# Patient Record
Sex: Female | Born: 1990 | Race: White | Hispanic: No | Marital: Married | Smoking: Current every day smoker
Health system: Southern US, Community
[De-identification: ages and names within clinical notes are randomized; demographics above are authoritative.]

## PROBLEM LIST (undated history)

## (undated) DIAGNOSIS — D649 Anemia, unspecified: Secondary | ICD-10-CM

## (undated) DIAGNOSIS — D849 Immunodeficiency, unspecified: Secondary | ICD-10-CM

## (undated) DIAGNOSIS — R198 Other specified symptoms and signs involving the digestive system and abdomen: Secondary | ICD-10-CM

## (undated) DIAGNOSIS — K589 Irritable bowel syndrome without diarrhea: Secondary | ICD-10-CM

## (undated) DIAGNOSIS — F329 Major depressive disorder, single episode, unspecified: Secondary | ICD-10-CM

## (undated) DIAGNOSIS — F32A Depression, unspecified: Secondary | ICD-10-CM

## (undated) DIAGNOSIS — F419 Anxiety disorder, unspecified: Secondary | ICD-10-CM

## (undated) HISTORY — PX: ABDOMINAL HYSTERECTOMY: SHX81

## (undated) HISTORY — PX: APPENDECTOMY: SHX54

## (undated) HISTORY — DX: Anxiety disorder, unspecified: F41.9

## (undated) HISTORY — PX: WISDOM TOOTH EXTRACTION: SHX21

## (undated) HISTORY — PX: TONSILLECTOMY: SUR1361

## (undated) HISTORY — PX: OTHER SURGICAL HISTORY: SHX169

---
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
## 2010-02-21 ENCOUNTER — Emergency Department: Payer: Self-pay | Admitting: Emergency Medicine

## 2010-06-01 ENCOUNTER — Emergency Department: Payer: Self-pay | Admitting: Emergency Medicine

## 2012-05-13 ENCOUNTER — Emergency Department: Payer: Self-pay | Admitting: Emergency Medicine

## 2012-05-13 LAB — CBC WITH DIFFERENTIAL/PLATELET
Basophil #: 0 10*3/uL (ref 0.0–0.1)
Basophil %: 0.2 %
Eosinophil #: 0 10*3/uL (ref 0.0–0.7)
Eosinophil %: 0.3 %
HCT: 39 % (ref 35.0–47.0)
HGB: 13.5 g/dL (ref 12.0–16.0)
Lymphocyte #: 2 10*3/uL (ref 1.0–3.6)
Lymphocyte %: 12.5 %
MCH: 29.1 pg (ref 26.0–34.0)
MCHC: 34.6 g/dL (ref 32.0–36.0)
MCV: 84 fL (ref 80–100)
Monocyte #: 0.6 x10 3/mm (ref 0.2–0.9)
Monocyte %: 3.5 %
Neutrophil #: 13.4 10*3/uL — ABNORMAL HIGH (ref 1.4–6.5)
Neutrophil %: 83.5 %
Platelet: 156 10*3/uL (ref 150–440)
RBC: 4.63 10*6/uL (ref 3.80–5.20)
RDW: 13.3 % (ref 11.5–14.5)
WBC: 16 10*3/uL — ABNORMAL HIGH (ref 3.6–11.0)

## 2012-05-13 LAB — URINALYSIS, COMPLETE
Bacteria: NONE SEEN
Bilirubin,UR: NEGATIVE
Glucose,UR: NEGATIVE mg/dL (ref 0–75)
Ketone: NEGATIVE
Leukocyte Esterase: NEGATIVE
Nitrite: NEGATIVE
Ph: 6 (ref 4.5–8.0)
Protein: NEGATIVE
RBC,UR: 6 /HPF (ref 0–5)
Specific Gravity: 1.008 (ref 1.003–1.030)
Squamous Epithelial: NONE SEEN
WBC UR: <1 /HPF (ref 0–5)

## 2012-05-13 LAB — COMPREHENSIVE METABOLIC PANEL
Albumin: 3.8 g/dL (ref 3.4–5.0)
Alkaline Phosphatase: 63 U/L (ref 50–136)
Anion Gap: 7 (ref 7–16)
BUN: 5 mg/dL — ABNORMAL LOW (ref 7–18)
Bilirubin,Total: 0.4 mg/dL (ref 0.2–1.0)
Calcium, Total: 8.6 mg/dL (ref 8.5–10.1)
Chloride: 109 mmol/L — ABNORMAL HIGH (ref 98–107)
Co2: 25 mmol/L (ref 21–32)
Creatinine: 0.76 mg/dL (ref 0.60–1.30)
EGFR (African American): >60
EGFR (Non-African Amer.): >60
Glucose: 114 mg/dL — ABNORMAL HIGH (ref 65–99)
Osmolality: 279 (ref 275–301)
Potassium: 3.6 mmol/L (ref 3.5–5.1)
SGOT(AST): 15 U/L (ref 15–37)
SGPT (ALT): 14 U/L (ref 12–78)
Sodium: 141 mmol/L (ref 136–145)
Total Protein: 6.3 g/dL — ABNORMAL LOW (ref 6.4–8.2)

## 2012-05-13 LAB — DRUG SCREEN, URINE

## 2012-05-13 LAB — LIPASE, BLOOD: Lipase: 125 U/L (ref 73–393)

## 2012-05-13 LAB — WET PREP, GENITAL

## 2012-05-13 LAB — PREGNANCY, URINE: Pregnancy Test, Urine: NEGATIVE m[IU]/mL

## 2012-12-08 ENCOUNTER — Emergency Department: Payer: Self-pay | Admitting: Emergency Medicine

## 2013-03-03 ENCOUNTER — Emergency Department: Payer: Self-pay | Admitting: Emergency Medicine

## 2013-03-03 LAB — BASIC METABOLIC PANEL
Anion Gap: 8 (ref 7–16)
BUN: 6 mg/dL — ABNORMAL LOW (ref 7–18)
Calcium, Total: 9.2 mg/dL (ref 8.5–10.1)
Chloride: 102 mmol/L (ref 98–107)
Co2: 26 mmol/L (ref 21–32)
Creatinine: 0.71 mg/dL (ref 0.60–1.30)
EGFR (African American): >60
EGFR (Non-African Amer.): >60
Glucose: 141 mg/dL — ABNORMAL HIGH (ref 65–99)
Osmolality: 272 (ref 275–301)
Potassium: 3.5 mmol/L (ref 3.5–5.1)
Sodium: 136 mmol/L (ref 136–145)

## 2013-03-03 LAB — CBC
HCT: 42.2 % (ref 35.0–47.0)
HGB: 14.4 g/dL (ref 12.0–16.0)
MCH: 28.2 pg (ref 26.0–34.0)
MCHC: 34.2 g/dL (ref 32.0–36.0)
MCV: 82 fL (ref 80–100)
Platelet: 193 10*3/uL (ref 150–440)
RBC: 5.12 10*6/uL (ref 3.80–5.20)
RDW: 13 % (ref 11.5–14.5)
WBC: 11.6 10*3/uL — ABNORMAL HIGH (ref 3.6–11.0)

## 2013-03-03 LAB — HCG, QUANTITATIVE, PREGNANCY: Beta Hcg, Quant.: 52869 m[IU]/mL — ABNORMAL HIGH

## 2013-03-15 ENCOUNTER — Emergency Department: Payer: Self-pay | Admitting: Unknown Physician Specialty

## 2013-03-15 LAB — COMPREHENSIVE METABOLIC PANEL
Albumin: 3.7 g/dL (ref 3.4–5.0)
Alkaline Phosphatase: 64 U/L (ref 50–136)
Anion Gap: 6 — ABNORMAL LOW (ref 7–16)
BUN: 5 mg/dL — ABNORMAL LOW (ref 7–18)
Bilirubin,Total: 0.4 mg/dL (ref 0.2–1.0)
Calcium, Total: 9 mg/dL (ref 8.5–10.1)
Chloride: 107 mmol/L (ref 98–107)
Co2: 25 mmol/L (ref 21–32)
Creatinine: 0.41 mg/dL — ABNORMAL LOW (ref 0.60–1.30)
EGFR (African American): >60
EGFR (Non-African Amer.): >60
Glucose: 84 mg/dL (ref 65–99)
Osmolality: 272 (ref 275–301)
Potassium: 4.1 mmol/L (ref 3.5–5.1)
SGOT(AST): 17 U/L (ref 15–37)
SGPT (ALT): 13 U/L (ref 12–78)
Sodium: 138 mmol/L (ref 136–145)
Total Protein: 6.4 g/dL (ref 6.4–8.2)

## 2013-03-15 LAB — CBC WITH DIFFERENTIAL/PLATELET
Basophil #: 0 10*3/uL (ref 0.0–0.1)
Basophil %: 0.4 %
Eosinophil #: 0 10*3/uL (ref 0.0–0.7)
Eosinophil %: 0.3 %
HCT: 38.8 % (ref 35.0–47.0)
HGB: 13.6 g/dL (ref 12.0–16.0)
Lymphocyte #: 2.3 10*3/uL (ref 1.0–3.6)
Lymphocyte %: 24.2 %
MCH: 28.6 pg (ref 26.0–34.0)
MCHC: 34.9 g/dL (ref 32.0–36.0)
MCV: 82 fL (ref 80–100)
Monocyte #: 0.4 x10 3/mm (ref 0.2–0.9)
Monocyte %: 4.3 %
Neutrophil #: 6.7 10*3/uL — ABNORMAL HIGH (ref 1.4–6.5)
Neutrophil %: 70.8 %
Platelet: 171 10*3/uL (ref 150–440)
RBC: 4.73 10*6/uL (ref 3.80–5.20)
RDW: 13.1 % (ref 11.5–14.5)
WBC: 9.5 10*3/uL (ref 3.6–11.0)

## 2013-03-15 LAB — URINALYSIS, COMPLETE
Bacteria: NONE SEEN
Bilirubin,UR: NEGATIVE
Blood: NEGATIVE
Glucose,UR: NEGATIVE mg/dL (ref 0–75)
Nitrite: NEGATIVE
Ph: 7 (ref 4.5–8.0)
Protein: NEGATIVE
RBC,UR: 1 /HPF (ref 0–5)
Specific Gravity: 1.013 (ref 1.003–1.030)
Squamous Epithelial: 2
WBC UR: 2 /HPF (ref 0–5)

## 2013-03-15 LAB — HCG, QUANTITATIVE, PREGNANCY: Beta Hcg, Quant.: 132333 m[IU]/mL — ABNORMAL HIGH

## 2013-04-14 ENCOUNTER — Encounter: Payer: Self-pay | Admitting: Obstetrics and Gynecology

## 2013-05-17 ENCOUNTER — Emergency Department: Payer: Self-pay | Admitting: Emergency Medicine

## 2013-05-17 LAB — CBC
HCT: 33 % — ABNORMAL LOW (ref 35.0–47.0)
HGB: 11.6 g/dL — ABNORMAL LOW (ref 12.0–16.0)
MCH: 29.7 pg (ref 26.0–34.0)
MCHC: 35.2 g/dL (ref 32.0–36.0)
MCV: 85 fL (ref 80–100)
Platelet: 154 10*3/uL (ref 150–440)
RBC: 3.9 10*6/uL (ref 3.80–5.20)
RDW: 14.4 % (ref 11.5–14.5)
WBC: 13.8 10*3/uL — ABNORMAL HIGH (ref 3.6–11.0)

## 2013-05-17 LAB — URINALYSIS, COMPLETE
Bilirubin,UR: NEGATIVE
Blood: NEGATIVE
Glucose,UR: NEGATIVE mg/dL (ref 0–75)
Ketone: NEGATIVE
Leukocyte Esterase: NEGATIVE
Nitrite: NEGATIVE
Ph: 7 (ref 4.5–8.0)
Protein: NEGATIVE
RBC,UR: 1 /HPF (ref 0–5)
Specific Gravity: 1.019 (ref 1.003–1.030)
Squamous Epithelial: NONE SEEN
WBC UR: 1 /HPF (ref 0–5)

## 2013-05-17 LAB — BASIC METABOLIC PANEL
Anion Gap: 8 (ref 7–16)
BUN: 6 mg/dL — ABNORMAL LOW (ref 7–18)
Calcium, Total: 9 mg/dL (ref 8.5–10.1)
Chloride: 106 mmol/L (ref 98–107)
Co2: 22 mmol/L (ref 21–32)
Creatinine: 0.49 mg/dL — ABNORMAL LOW (ref 0.60–1.30)
EGFR (African American): >60
EGFR (Non-African Amer.): >60
Glucose: 83 mg/dL (ref 65–99)
Osmolality: 269 (ref 275–301)
Potassium: 3.6 mmol/L (ref 3.5–5.1)
Sodium: 136 mmol/L (ref 136–145)

## 2013-05-17 LAB — HCG, QUANTITATIVE, PREGNANCY: Beta Hcg, Quant.: 9922 m[IU]/mL — ABNORMAL HIGH

## 2013-05-18 LAB — WET PREP, GENITAL

## 2013-08-19 ENCOUNTER — Observation Stay: Payer: Self-pay

## 2013-08-20 LAB — URINALYSIS, COMPLETE
Bacteria: NONE SEEN
Bilirubin,UR: NEGATIVE
Blood: NEGATIVE
Glucose,UR: NEGATIVE mg/dL (ref 0–75)
Ketone: NEGATIVE
Leukocyte Esterase: NEGATIVE
Nitrite: NEGATIVE
Ph: 6 (ref 4.5–8.0)
Protein: NEGATIVE
RBC,UR: <1 /HPF (ref 0–5)
Specific Gravity: 1.012 (ref 1.003–1.030)
Squamous Epithelial: 1
WBC UR: <1 /HPF (ref 0–5)

## 2013-08-24 ENCOUNTER — Observation Stay: Payer: Self-pay | Admitting: Obstetrics and Gynecology

## 2013-09-08 DIAGNOSIS — D649 Anemia, unspecified: Secondary | ICD-10-CM

## 2013-09-08 HISTORY — DX: Anemia, unspecified: D64.9

## 2013-09-11 ENCOUNTER — Emergency Department: Payer: Self-pay | Admitting: Internal Medicine

## 2013-09-11 LAB — RAPID INFLUENZA A&B ANTIGENS

## 2013-09-13 LAB — BETA STREP CULTURE(ARMC)

## 2013-09-18 ENCOUNTER — Observation Stay: Payer: Self-pay | Admitting: Obstetrics and Gynecology

## 2013-09-18 LAB — CBC WITH DIFFERENTIAL/PLATELET
Basophil #: 0 10*3/uL (ref 0.0–0.1)
Basophil %: 0.2 %
Eosinophil #: 0 10*3/uL (ref 0.0–0.7)
Eosinophil %: 0.2 %
HCT: 27.6 % — ABNORMAL LOW (ref 35.0–47.0)
HGB: 9.4 g/dL — ABNORMAL LOW (ref 12.0–16.0)
Lymphocyte #: 0.7 10*3/uL — ABNORMAL LOW (ref 1.0–3.6)
Lymphocyte %: 7.8 %
MCH: 26.3 pg (ref 26.0–34.0)
MCHC: 33.9 g/dL (ref 32.0–36.0)
MCV: 78 fL — ABNORMAL LOW (ref 80–100)
Monocyte #: 0.4 x10 3/mm (ref 0.2–0.9)
Monocyte %: 4.6 %
Neutrophil #: 7.3 10*3/uL — ABNORMAL HIGH (ref 1.4–6.5)
Neutrophil %: 87.2 %
Platelet: 124 10*3/uL — ABNORMAL LOW (ref 150–440)
RBC: 3.55 10*6/uL — ABNORMAL LOW (ref 3.80–5.20)
RDW: 14 % (ref 11.5–14.5)
WBC: 8.4 10*3/uL (ref 3.6–11.0)

## 2013-09-18 LAB — URINALYSIS, COMPLETE
Bacteria: NONE SEEN
Bilirubin,UR: NEGATIVE
Blood: NEGATIVE
Glucose,UR: 50 mg/dL (ref 0–75)
Ketone: NEGATIVE
Leukocyte Esterase: NEGATIVE
Nitrite: NEGATIVE
Ph: 7 (ref 4.5–8.0)
Protein: NEGATIVE
RBC,UR: 1 /HPF (ref 0–5)
Specific Gravity: 1.008 (ref 1.003–1.030)
Squamous Epithelial: 1
WBC UR: 1 /HPF (ref 0–5)

## 2013-10-19 ENCOUNTER — Inpatient Hospital Stay: Payer: Self-pay

## 2013-10-19 LAB — CBC WITH DIFFERENTIAL/PLATELET
Basophil #: 0 10*3/uL (ref 0.0–0.1)
Basophil %: 0.3 %
Eosinophil #: 0 10*3/uL (ref 0.0–0.7)
Eosinophil %: 0.2 %
HCT: 33 % — ABNORMAL LOW (ref 35.0–47.0)
HGB: 10.9 g/dL — ABNORMAL LOW (ref 12.0–16.0)
Lymphocyte #: 2.4 10*3/uL (ref 1.0–3.6)
Lymphocyte %: 17.3 %
MCH: 25.3 pg — ABNORMAL LOW (ref 26.0–34.0)
MCHC: 32.9 g/dL (ref 32.0–36.0)
MCV: 77 fL — ABNORMAL LOW (ref 80–100)
Monocyte #: 0.6 x10 3/mm (ref 0.2–0.9)
Monocyte %: 4.3 %
Neutrophil #: 11.1 10*3/uL — ABNORMAL HIGH (ref 1.4–6.5)
Neutrophil %: 77.9 %
Platelet: 137 10*3/uL — ABNORMAL LOW (ref 150–440)
RBC: 4.29 10*6/uL (ref 3.80–5.20)
RDW: 14.5 % (ref 11.5–14.5)
WBC: 14.2 10*3/uL — ABNORMAL HIGH (ref 3.6–11.0)

## 2013-10-19 LAB — GC/CHLAMYDIA PROBE AMP

## 2013-10-20 LAB — HEMATOCRIT: HCT: 28.6 % — ABNORMAL LOW (ref 35.0–47.0)

## 2014-02-11 ENCOUNTER — Observation Stay: Payer: Self-pay | Admitting: Surgery

## 2014-02-11 LAB — COMPREHENSIVE METABOLIC PANEL
Albumin: 3.7 g/dL (ref 3.4–5.0)
Alkaline Phosphatase: 65 U/L
Anion Gap: 7 (ref 7–16)
BUN: 8 mg/dL (ref 7–18)
Bilirubin,Total: 0.3 mg/dL (ref 0.2–1.0)
Calcium, Total: 9 mg/dL (ref 8.5–10.1)
Chloride: 107 mmol/L (ref 98–107)
Co2: 26 mmol/L (ref 21–32)
Creatinine: 0.66 mg/dL (ref 0.60–1.30)
EGFR (African American): >60
EGFR (Non-African Amer.): >60
Glucose: 101 mg/dL — ABNORMAL HIGH (ref 65–99)
Osmolality: 278 (ref 275–301)
Potassium: 3.6 mmol/L (ref 3.5–5.1)
SGOT(AST): 16 U/L (ref 15–37)
SGPT (ALT): 25 U/L (ref 12–78)
Sodium: 140 mmol/L (ref 136–145)
Total Protein: 6.7 g/dL (ref 6.4–8.2)

## 2014-02-11 LAB — CBC WITH DIFFERENTIAL/PLATELET
Basophil #: 0 10*3/uL (ref 0.0–0.1)
Basophil %: 0.2 %
Eosinophil #: 0 10*3/uL (ref 0.0–0.7)
Eosinophil %: 0.3 %
HCT: 40.1 % (ref 35.0–47.0)
HGB: 12.9 g/dL (ref 12.0–16.0)
Lymphocyte #: 2.3 10*3/uL (ref 1.0–3.6)
Lymphocyte %: 17.9 %
MCH: 25.9 pg — ABNORMAL LOW (ref 26.0–34.0)
MCHC: 32.2 g/dL (ref 32.0–36.0)
MCV: 81 fL (ref 80–100)
Monocyte #: 0.4 x10 3/mm (ref 0.2–0.9)
Monocyte %: 2.7 %
Neutrophil #: 10.2 10*3/uL — ABNORMAL HIGH (ref 1.4–6.5)
Neutrophil %: 78.9 %
Platelet: 177 10*3/uL (ref 150–440)
RBC: 4.98 10*6/uL (ref 3.80–5.20)
RDW: 13.6 % (ref 11.5–14.5)
WBC: 12.9 10*3/uL — ABNORMAL HIGH (ref 3.6–11.0)

## 2014-02-11 LAB — URINALYSIS, COMPLETE
Bacteria: NONE SEEN
Bilirubin,UR: NEGATIVE
Blood: NEGATIVE
Glucose,UR: NEGATIVE mg/dL (ref 0–75)
Ketone: NEGATIVE
Leukocyte Esterase: NEGATIVE
Nitrite: NEGATIVE
Ph: 7 (ref 4.5–8.0)
Protein: NEGATIVE
RBC,UR: <1 /HPF (ref 0–5)
Specific Gravity: 1.017 (ref 1.003–1.030)
Squamous Epithelial: 1
WBC UR: 2 /HPF (ref 0–5)

## 2014-02-11 LAB — WET PREP, GENITAL

## 2014-02-11 LAB — GC/CHLAMYDIA PROBE AMP

## 2014-02-14 LAB — PATHOLOGY REPORT

## 2014-05-09 DIAGNOSIS — R200 Anesthesia of skin: Secondary | ICD-10-CM | POA: Insufficient documentation

## 2014-05-09 DIAGNOSIS — R202 Paresthesia of skin: Secondary | ICD-10-CM | POA: Insufficient documentation

## 2014-05-09 DIAGNOSIS — H539 Unspecified visual disturbance: Secondary | ICD-10-CM | POA: Insufficient documentation

## 2014-06-27 DIAGNOSIS — G43909 Migraine, unspecified, not intractable, without status migrainosus: Secondary | ICD-10-CM | POA: Insufficient documentation

## 2014-08-10 ENCOUNTER — Emergency Department: Payer: Self-pay | Admitting: Emergency Medicine

## 2014-08-10 LAB — COMPREHENSIVE METABOLIC PANEL
Albumin: 3.7 g/dL (ref 3.4–5.0)
Alkaline Phosphatase: 78 U/L
Anion Gap: 8 (ref 7–16)
BUN: 12 mg/dL (ref 7–18)
Bilirubin,Total: 0.2 mg/dL (ref 0.2–1.0)
Calcium, Total: 8.7 mg/dL (ref 8.5–10.1)
Chloride: 106 mmol/L (ref 98–107)
Co2: 26 mmol/L (ref 21–32)
Creatinine: 0.8 mg/dL (ref 0.60–1.30)
EGFR (African American): >60
EGFR (Non-African Amer.): >60
Glucose: 107 mg/dL — ABNORMAL HIGH (ref 65–99)
Osmolality: 280 (ref 275–301)
Potassium: 3.3 mmol/L — ABNORMAL LOW (ref 3.5–5.1)
SGOT(AST): 10 U/L — ABNORMAL LOW (ref 15–37)
SGPT (ALT): 16 U/L
Sodium: 140 mmol/L (ref 136–145)
Total Protein: 6.7 g/dL (ref 6.4–8.2)

## 2014-08-10 LAB — URINALYSIS, COMPLETE
Bacteria: NONE SEEN
Bilirubin,UR: NEGATIVE
Blood: NEGATIVE
Glucose,UR: NEGATIVE mg/dL (ref 0–75)
Ketone: NEGATIVE
Leukocyte Esterase: NEGATIVE
Nitrite: NEGATIVE
Ph: 6 (ref 4.5–8.0)
Protein: NEGATIVE
RBC,UR: NONE SEEN /HPF (ref 0–5)
Specific Gravity: 1.02 (ref 1.003–1.030)
Squamous Epithelial: <1
WBC UR: 1 /HPF (ref 0–5)

## 2014-08-10 LAB — CBC
HCT: 38.8 % (ref 35.0–47.0)
HGB: 12.7 g/dL (ref 12.0–16.0)
MCH: 27 pg (ref 26.0–34.0)
MCHC: 32.7 g/dL (ref 32.0–36.0)
MCV: 83 fL (ref 80–100)
Platelet: 235 10*3/uL (ref 150–440)
RBC: 4.7 10*6/uL (ref 3.80–5.20)
RDW: 14 % (ref 11.5–14.5)
WBC: 10.8 10*3/uL (ref 3.6–11.0)

## 2014-08-10 LAB — PREGNANCY, URINE: Pregnancy Test, Urine: NEGATIVE m[IU]/mL

## 2014-08-10 LAB — LIPASE, BLOOD: Lipase: 185 U/L (ref 73–393)

## 2014-08-25 DIAGNOSIS — Z862 Personal history of diseases of the blood and blood-forming organs and certain disorders involving the immune mechanism: Secondary | ICD-10-CM | POA: Insufficient documentation

## 2014-08-28 DIAGNOSIS — F419 Anxiety disorder, unspecified: Secondary | ICD-10-CM | POA: Insufficient documentation

## 2014-12-30 NOTE — Op Note (Signed)
PATIENT NAME:  Erika Sanders, Erika Sanders MR#:  [id number] DATE OF BIRTH:  August 01, 1991  DATE OF PROCEDURE:  02/11/2014  ATTENDING PHYSICIAN: Ida Roguehristopher Lundquist, M.D.   PREOPERATIVE DIAGNOSIS: Acute appendicitis.   POSTOPERATIVE DIAGNOSIS: Acute appendicitis.   PROCEDURE PERFORMED: Laparoscopic appendectomy.   ANESTHESIA: General.   SPECIMEN: Appendix.   ESTIMATED BLOOD LOSS: 5 mL.   COMPLICATIONS: None.   INDICATION FOR SURGERY: [name] is a pleasant 24 year old female who presents with right lower quadrant pain, leukocytosis and CT findings suggestive of appendicitis. She was brought to the operating room for laparoscopic appendectomy.   DETAILS OF PROCEDURE: As follows: Informed consent was obtained. [name] was brought to the operating room suite. She was induced. Endotracheal tube was placed. General anesthesia was administered. Her abdomen was then prepped and draped in standard surgical fashion. A timeout was then performed correctly identifying the patient name, operative site and procedure to be performed. A supraumbilical incision was made. This was deepened down to the fascia. The fascia was incised. The peritoneum was entered. Two stay sutures were placed through the fasciotomy. A Hasson trocar was placed through the abdomen. The abdomen was insufflated. Left lower quadrant 5 mm and suprapubic 5 mm trocars were placed. The appendix was grasped. It was lifted off from the surrounding tissue. It appeared to be obviously dilated at the tip and hard. I then made a defect into the mesoappendix at the base of the appendix and placed a laparoscopic endostapler across the base of the appendix. This was ligated. The mesoappendix was then ligated. The appendix was then taken out through an Endo Catch bag. The abdomen was irrigated. There was no obvious bleeding, and hemostasis was obtained. The trocars were then taken out under direct visualization. The abdomen was desufflated. The previously  placed stay sutures were tied together to close the fasciotomy. The skin was closed using 4-0 Monocryl deep dermal sutures. Steri-Strips, Telfa gauze and Tegaderm were then placed over the wounds. The patient was then awoken, extubated and brought to the postanesthesia care unit. There were no immediate complications. Needle, sponge and instrument counts were correct at the end of the procedure.   ____________________________ Si Raiderhristopher A. Lundquist, MD cal:gb D: 02/23/2014 19:19:56 ET T: 02/24/2014 04:05:42 ET JOB#: [id number]  cc: Cristal Deerhristopher A. Lundquist, MD, <Dictator> Jarvis NewcomerHRISTOPHER A LUNDQUIST MD ELECTRONICALLY SIGNED 02/28/2014 10:47

## 2014-12-30 NOTE — H&P (Signed)
   Subjective/Chief Complaint RLQ pain, nausea   History of Present Illness [name] is a pleasant 24 yo F who presents with 1 day of RLQ pain and nausea.  Began acutely this am.  Getting worse.  No diarrhea/constipation.  Tolerating diet prior to this.  No sick contacts, no unusual ingestions.  CT shows thickened appendix with periappendiceal stranding.  WBC 12.9.   Past Med/Surgical Hx:  Denies medical history:   Adenoidectomy:   tonsillectomy:   ALLERGIES:  Other -Explain in Comment Field: Unknown  Lactose: GI Distress  Family and Social History:  Family History Coronary Artery Disease  Hypertension  Diabetes Mellitus  Cancer  Skin cancers on father's side   Social History negative tobacco, negative ETOH   Place of Living Home   Review of Systems:  Subjective/Chief Complaint RLQ pain, nausea   Fever/Chills No   Cough No   Sputum No   Abdominal Pain Yes   Diarrhea No   Constipation No   Nausea/Vomiting Yes   SOB/DOE No   Chest Pain No   Dysuria No   Tolerating PT No   Tolerating Diet No  Nauseated   Physical Exam:  GEN well developed, well nourished, no acute distress   HEENT pink conjunctivae, PERRL, hearing intact to voice   RESP normal resp effort  clear BS  no use of accessory muscles   CARD regular rate  no murmur  no thrills   ABD positive tenderness  denies Flank Tenderness  no hernia  soft  normal BS   LYMPH negative neck, negative axillae   SKIN normal to palpation, No rashes, No ulcers   NEURO cranial nerves intact, negative rigidity, negative tremor, strength:   PSYCH alert, A+O to time, place, person, poor insight    Assessment/Admission Diagnosis [name] is a pleasant 24 yo F who presents with RLQ pain, nausea.  CT shows findings consistent with appendicitis and WBC elevated at 12.9.  Clinical and radiographic appendicitis.   Plan To OR for lap appendectomy.   Electronic Signatures: Jarvis NewcomerLundquist, Adrain Butrick A (MD)  (Signed  06-Jun-15 16:40)  Authored: CHIEF COMPLAINT and HISTORY, PAST MEDICAL/SURGIAL HISTORY, ALLERGIES, FAMILY AND SOCIAL HISTORY, REVIEW OF SYSTEMS, PHYSICAL EXAM, ASSESSMENT AND PLAN   Last Updated: 06-Jun-15 16:40 by Jarvis NewcomerLundquist, Mohanad Carsten A (MD)

## 2015-01-16 NOTE — H&P (Signed)
L&D Evaluation:  History:  HPI 24 yo G2P1001 with LMP of 01/12/13 & EDD of 10/20/13 [facility] for [facility] 0500am [facility] for anemia, 1st trimester bleeding, Breech and had a late move to vtx. UC's are very 2-5 mins and mild. No Vb,decreased FM or meconium seen.   Presents with leaking fluid   Patient's Medical History Anemia, 1st trimester bleeding   Patient's Surgical History none   Medications Pre Natal Vitamins   Allergies Sulfa, dairy grass   Social History none   Family History Non-Contributory   ROS:  ROS All systems were reviewed.  HEENT, CNS, GI, GU, Respiratory, CV, Renal and Musculoskeletal systems were found to be normal.   Exam:  Vital Signs stable  117/77   General no apparent distress   Mental Status clear   Chest clear   Heart normal sinus rhythm   Abdomen gravid, non-tender   Estimated Fetal Weight Average for gestational age   Fetal Position vtx   Back no CVAT   Edema 1+   Reflexes 1+   Clonus negative   Pelvic 2/70/vtx-2   Mebranes Ruptured, 0500am   Description clear   FHT normal rate with no decels, Cat I, reactive NST   Ucx regular   Skin dry   Lymph no lymphadenopathy   Impression:  Impression early labor   Plan:  Plan monitor contractions and for cervical change, GBs neg   Electronic Signatures: Sharee PimpleJones, Dereonna Lensing W (CNM)  (Signed 11-Feb-15 09:17)  Authored: L&D Evaluation   Last Updated: 11-Feb-15 09:17 by Sharee PimpleJones, Kahlan Engebretson W (CNM)

## 2015-01-19 ENCOUNTER — Other Ambulatory Visit: Payer: Self-pay | Admitting: Unknown Physician Specialty

## 2015-01-19 DIAGNOSIS — R1084 Generalized abdominal pain: Secondary | ICD-10-CM

## 2015-01-19 DIAGNOSIS — R1032 Left lower quadrant pain: Secondary | ICD-10-CM | POA: Insufficient documentation

## 2015-01-19 DIAGNOSIS — R197 Diarrhea, unspecified: Secondary | ICD-10-CM

## 2015-01-19 DIAGNOSIS — R112 Nausea with vomiting, unspecified: Secondary | ICD-10-CM

## 2015-01-19 DIAGNOSIS — R198 Other specified symptoms and signs involving the digestive system and abdomen: Secondary | ICD-10-CM | POA: Insufficient documentation

## 2015-01-26 ENCOUNTER — Ambulatory Visit: Payer: Self-pay

## 2015-01-26 ENCOUNTER — Other Ambulatory Visit: Payer: Self-pay

## 2015-01-31 ENCOUNTER — Encounter
Admission: RE | Admit: 2015-01-31 | Discharge: 2015-01-31 | Disposition: A | Discharge status: Home / Self Care | Payer: Medicaid Other | Source: Ambulatory Visit | Attending: Unknown Physician Specialty | Admitting: Unknown Physician Specialty

## 2015-01-31 ENCOUNTER — Ambulatory Visit
Admission: RE | Admit: 2015-01-31 | Discharge: 2015-01-31 | Disposition: A | Discharge status: Home / Self Care | Payer: Medicaid Other | Source: Ambulatory Visit | Attending: Unknown Physician Specialty | Admitting: Unknown Physician Specialty

## 2015-01-31 DIAGNOSIS — R1084 Generalized abdominal pain: Secondary | ICD-10-CM

## 2015-01-31 DIAGNOSIS — R197 Diarrhea, unspecified: Secondary | ICD-10-CM

## 2015-01-31 DIAGNOSIS — R112 Nausea with vomiting, unspecified: Secondary | ICD-10-CM | POA: Insufficient documentation

## 2015-01-31 MED ORDER — SINCALIDE 5 MCG IJ SOLR
0.0200 ug/kg | Freq: Once | INTRAMUSCULAR | Status: AC
  Administered 2015-01-31: 1.04 ug via INTRAVENOUS

## 2015-01-31 MED ORDER — TECHNETIUM TC 99M MEBROFENIN IV KIT
5.3930 | PACK | Freq: Once | INTRAVENOUS | Status: AC | PRN
  Administered 2015-01-31: 5.39 via INTRAVENOUS

## 2015-02-08 ENCOUNTER — Inpatient Hospital Stay: Payer: Medicaid Other | Attending: Hematology and Oncology | Admitting: Hematology and Oncology

## 2015-02-08 ENCOUNTER — Ambulatory Visit: Payer: Medicaid Other

## 2015-02-08 ENCOUNTER — Encounter: Payer: Self-pay | Admitting: Hematology and Oncology

## 2015-02-08 VITALS — BP 109/74 | HR 93 | Temp 98.3°F | Resp 17 | Ht 61.0 in | Wt 116.4 lb

## 2015-02-08 DIAGNOSIS — R51 Headache: Secondary | ICD-10-CM | POA: Diagnosis not present

## 2015-02-08 DIAGNOSIS — D801 Nonfamilial hypogammaglobulinemia: Secondary | ICD-10-CM | POA: Insufficient documentation

## 2015-02-08 DIAGNOSIS — R200 Anesthesia of skin: Secondary | ICD-10-CM | POA: Diagnosis not present

## 2015-02-08 DIAGNOSIS — F17210 Nicotine dependence, cigarettes, uncomplicated: Secondary | ICD-10-CM | POA: Diagnosis not present

## 2015-02-08 DIAGNOSIS — Z87440 Personal history of urinary (tract) infections: Secondary | ICD-10-CM | POA: Insufficient documentation

## 2015-02-08 DIAGNOSIS — R768 Other specified abnormal immunological findings in serum: Secondary | ICD-10-CM | POA: Diagnosis not present

## 2015-02-08 DIAGNOSIS — K5900 Constipation, unspecified: Secondary | ICD-10-CM

## 2015-02-08 DIAGNOSIS — F419 Anxiety disorder, unspecified: Secondary | ICD-10-CM

## 2015-02-08 DIAGNOSIS — R197 Diarrhea, unspecified: Secondary | ICD-10-CM | POA: Diagnosis not present

## 2015-02-08 DIAGNOSIS — Z79899 Other long term (current) drug therapy: Secondary | ICD-10-CM

## 2015-02-08 LAB — CBC WITH DIFFERENTIAL/PLATELET
Basophils Absolute: 0.1 10*3/uL (ref 0–0.1)
Basophils Relative: 1 %
Eosinophils Absolute: 0.1 10*3/uL (ref 0–0.7)
Eosinophils Relative: 1 %
HCT: 37.4 % (ref 35.0–47.0)
Hemoglobin: 11.9 g/dL — ABNORMAL LOW (ref 12.0–16.0)
Lymphocytes Relative: 31 %
Lymphs Abs: 3.1 10*3/uL (ref 1.0–3.6)
MCH: 25.3 pg — ABNORMAL LOW (ref 26.0–34.0)
MCHC: 31.8 g/dL — ABNORMAL LOW (ref 32.0–36.0)
MCV: 79.4 fL — ABNORMAL LOW (ref 80.0–100.0)
Monocytes Absolute: 0.4 10*3/uL (ref 0.2–0.9)
Monocytes Relative: 4 %
Neutro Abs: 6.2 10*3/uL (ref 1.4–6.5)
Neutrophils Relative %: 63 %
Platelets: 182 10*3/uL (ref 150–440)
RBC: 4.72 MIL/uL (ref 3.80–5.20)
RDW: 13.7 % (ref 11.5–14.5)
WBC: 9.8 10*3/uL (ref 3.6–11.0)

## 2015-02-08 LAB — URINALYSIS COMPLETE WITH MICROSCOPIC (ARMC ONLY)
Bacteria, UA: NONE SEEN
Bilirubin Urine: NEGATIVE
Glucose, UA: NEGATIVE mg/dL
Hgb urine dipstick: NEGATIVE
Ketones, ur: NEGATIVE mg/dL
Leukocytes, UA: NEGATIVE
Nitrite: NEGATIVE
Protein, ur: NEGATIVE mg/dL
Specific Gravity, Urine: 1.016 (ref 1.005–1.030)
pH: 7 (ref 5.0–8.0)

## 2015-02-08 LAB — COMPREHENSIVE METABOLIC PANEL
ALT: 15 U/L (ref 14–54)
AST: 15 U/L (ref 15–41)
Albumin: 3.6 g/dL (ref 3.5–5.0)
Alkaline Phosphatase: 47 U/L (ref 38–126)
Anion gap: 4 — ABNORMAL LOW (ref 5–15)
BUN: 12 mg/dL (ref 6–20)
CO2: 27 mmol/L (ref 22–32)
Calcium: 8.6 mg/dL — ABNORMAL LOW (ref 8.9–10.3)
Chloride: 105 mmol/L (ref 101–111)
Creatinine, Ser: 0.41 mg/dL — ABNORMAL LOW (ref 0.44–1.00)
GFR calc Af Amer: >60 mL/min (ref >60)
GFR calc non Af Amer: >60 mL/min (ref >60)
Glucose, Bld: 81 mg/dL (ref 65–99)
Potassium: 4.1 mmol/L (ref 3.5–5.1)
Sodium: 136 mmol/L (ref 135–145)
Total Bilirubin: 0.2 mg/dL — ABNORMAL LOW (ref 0.3–1.2)
Total Protein: 5.6 g/dL — ABNORMAL LOW (ref 6.5–8.1)

## 2015-02-08 LAB — SEDIMENTATION RATE: Sed Rate: 4 mm/hr (ref 0–20)

## 2015-02-09 LAB — IGG, IGA, IGM
IgA: 48 mg/dL — ABNORMAL LOW (ref 87–352)
IgG (Immunoglobin G), Serum: 171 mg/dL — ABNORMAL LOW (ref 700–1600)
IgM, Serum: 44 mg/dL (ref 26–217)

## 2015-02-09 LAB — PROTEIN ELECTROPHORESIS, SERUM
A/G Ratio: 1.8 (ref 0.7–2.0)
Albumin ELP: 3.4 g/dL (ref 3.2–5.6)
Alpha-1-Globulin: 0.2 g/dL (ref 0.1–0.4)
Alpha-2-Globulin: 0.6 g/dL (ref 0.4–1.2)
Beta Globulin: 0.8 g/dL (ref 0.6–1.3)
Gamma Globulin: 0.2 g/dL — ABNORMAL LOW (ref 0.5–1.6)
Globulin, Total: 1.9 g/dL — ABNORMAL LOW (ref 2.0–4.5)
Total Protein ELP: 5.3 g/dL — ABNORMAL LOW (ref 6.0–8.5)

## 2015-02-09 LAB — IGE: IgE (Immunoglobulin E), Serum: <1 IU/mL (ref 0–100)

## 2015-02-09 LAB — HIV ANTIBODY (ROUTINE TESTING W REFLEX): HIV Screen 4th Generation wRfx: NONREACTIVE

## 2015-02-10 LAB — COMPLEMENT, TOTAL: Compl, Total (CH50): 56 U/mL (ref 42–60)

## 2015-02-14 ENCOUNTER — Telehealth: Payer: Self-pay | Admitting: *Deleted

## 2015-02-14 NOTE — Telephone Encounter (Signed)
Returned phone call to pt and pt has appointment tomorrow with Dr. Merlene Pullingorcoran to discuss lab results; pt verbalized understanding of

## 2015-02-14 NOTE — Telephone Encounter (Signed)
Olegario MessierKathy,  Can you call patient? I will review labs first.  I thought she had a follow-up appt with us.  M

## 2015-02-15 ENCOUNTER — Inpatient Hospital Stay (HOSPITAL_BASED_OUTPATIENT_CLINIC_OR_DEPARTMENT_OTHER): Payer: Medicaid Other | Admitting: Hematology and Oncology

## 2015-02-15 VITALS — BP 103/70 | HR 90 | Temp 98.5°F | Ht 61.0 in | Wt 114.2 lb

## 2015-02-15 DIAGNOSIS — R197 Diarrhea, unspecified: Secondary | ICD-10-CM | POA: Diagnosis not present

## 2015-02-15 DIAGNOSIS — R51 Headache: Secondary | ICD-10-CM

## 2015-02-15 DIAGNOSIS — R768 Other specified abnormal immunological findings in serum: Secondary | ICD-10-CM | POA: Diagnosis not present

## 2015-02-15 DIAGNOSIS — R200 Anesthesia of skin: Secondary | ICD-10-CM

## 2015-02-15 DIAGNOSIS — F17210 Nicotine dependence, cigarettes, uncomplicated: Secondary | ICD-10-CM

## 2015-02-15 DIAGNOSIS — K5900 Constipation, unspecified: Secondary | ICD-10-CM | POA: Diagnosis not present

## 2015-02-15 DIAGNOSIS — Z79899 Other long term (current) drug therapy: Secondary | ICD-10-CM

## 2015-02-15 DIAGNOSIS — D801 Nonfamilial hypogammaglobulinemia: Secondary | ICD-10-CM

## 2015-02-15 DIAGNOSIS — Z87440 Personal history of urinary (tract) infections: Secondary | ICD-10-CM

## 2015-02-15 DIAGNOSIS — F419 Anxiety disorder, unspecified: Secondary | ICD-10-CM

## 2015-02-15 NOTE — Progress Notes (Signed)
[place] [facility] day:  02/08/2015  Chief Complaint: Erika Sanders is an 24 y.o. 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  HPI: The patient notes a history GI problems perhaps beginning approximately 5 years ago. She states that she would eat and 30 minutes later have to go to the bathroom. She has alternating constipation and diarrhea. She states that she has had an abdominal ultrasound and HIDA scan which were both negative.  She denies any history of fever. She notes frequent UTIs when she was pregnant (every other month).  She denies any respiratory infections.  She denies any history of gingivitis.  She is up-to-date on her immunizations.  She has lost 15 pounds in the last few months.    She states her diet varies.  She eats "junk food". She feels full quickly.  She notes easy bruising for the past 6 months. She denies any aspirin or ibuprofen use. She denies any adenopathy.  She describes a headache and tingling in her hands and feet. Her vision has been off for the past 2 years (feels anxiety related).  CBC on 01/19/2015 revealed a hematocrit 39.8, hemoglobin 12.9, platelets 225,000, white count 11,200 with an [place] of 7220. Comprehensive metabolic panel was normal including a creatinine of 0.6 and albumin 4.3. Protein was slightly low at 6 (6.1-7.9). IgA was 53 (87-352) and IgG 198 [phone]). Deamidated gliadin antibody and transglutaminase IgG  were negative.  Abdominal ultrasound on 01/31/2015 was normal.  HIV testing 1 year ago was negative.  Past Medical History  Diagnosis Date  . Anxiety     Past Surgical History  Procedure Laterality Date  . Appendectomy    . Tonsillectomy    . Wisdom tooth extraction      Family History  Problem Relation Age of Onset  . Hypertension Mother   . Hypercholesterolemia Mother   . Hypertension Father   .  Hypercholesterolemia Father   . Stroke Paternal Aunt   . Aneurysm Other     Social History:  reports that she has been smoking Cigarettes.  She has been smoking about 0.50 packs per day. She has never used smokeless tobacco. She reports that she does not drink alcohol or use illicit drugs.  She works at [employer].  She has a 53 year old son and a 92 year old daughter. The patient is accompanied by her mother.  Allergies:  Allergies  Allergen Reactions  . Lactase Nausea And Vomiting  . Other Other (See Comments)    Grass  . Gramineae Pollens Rash  . Lactose Nausea Only    Current Medications: Current Outpatient Prescriptions  Medication Sig Dispense Refill  . etonogestrel (NEXPLANON) 68 MG IMPL implant 68 mg by Implant route continuous.    . hydrOXYzine (ATARAX/VISTARIL) 25 MG tablet Take 25 mg by mouth every 8 (eight) hours as needed.     No current facility-administered medications for this visit.    Review of Systems:  GENERAL:  Feels good.  Active.  No fevers or sweats.  Weight loss of 15# in the past few months.. PERFORMANCE STATUS (ECOG):  1-2 HEENT:  Blurred vision off/on x 2 years.  No runny nose, sore throat, mouth sores or tenderness. Lungs: Minimal cough x 1 week.  No shortness of breath.  No hemoptysis. Cardiac:  No chest pain, palpitations, orthopnea, or PND. GI:  Early satiety.  Intermittent nausea,  vomiting, cramping, bloating, diarrhea alternating with constipation.  No melena or hematochezia. GU:  No urgency, frequency, dysuria, or hematuria. Musculoskeletal:  No back pain.  No joint pain.  No muscle tenderness. Extremities:  No pain or swelling. Skin:  No rashes or skin changes.  No after bath itching.  Easy bruising x 6 months. Neuro:  No headache, numbness or weakness, balance or coordination issues. Endocrine:  No diabetes, thyroid issues, hot flashes or night sweats. Psych:  Anxiety.  No depression. Pain:  No focal pain. Review of systems:  All other  systems reviewed and found to be negative.   Physical Exam: Blood pressure 109/74, pulse 93, temperature 98.3 F (36.8 C), temperature source Oral, resp. rate 17, height 5' 1"  (1.549 m), weight 116 lb 6.5 oz (52.8 kg). GENERAL:  Well developed, well nourished, sitting comfortably in the exam room in no acute distress. MENTAL STATUS:  Alert and oriented to person, place and time. HEAD: Hair pulled back.  Normocephalic, atraumatic, face symmetric, no Cushingoid features. EYES:  Glasses.  Brown eyes.  Pupils equal round and reactive to light and accomodation.  No conjunctivitis or scleral icterus. ENT:  Oropharynx clear without lesion.  Tongue normal. Mucous membranes moist. No gingivitis. RESPIRATORY:  Clear to auscultation without rales, wheezes or rhonchi. CARDIOVASCULAR:  Regular rate and rhythm without murmur, rub or gallop. ABDOMEN:  Soft, non-tender, with active bowel sounds, and no hepatosplenomegaly.  No masses. SKIN:  Ringworm like rash.  No ulcers. EXTREMITIES: No edema, no skin discoloration or tenderness.  No palpable cords. LYMPH NODES: No palpable cervical, supraclavicular, axillary or inguinal adenopathy  NEUROLOGICAL: Unremarkable. PSYCH:  Appropriate.   Appointment on 02/08/2015  Component Date Value Ref Range Status  . HIV Screen 4th Generation wRfx 02/08/2015 Non Reactive  Non Reactive Final   Comment: (NOTE) Performed At: [facility] [place], [place] [id number] Lindon Romp MD WC:[id number]   . WBC 02/08/2015 9.8  3.6 - 11.0 K/uL Final  . RBC 02/08/2015 4.72  3.80 - 5.20 MIL/uL Final  . Hemoglobin 02/08/2015 11.9* 12.0 - 16.0 g/dL Final  . HCT 02/08/2015 37.4  35.0 - 47.0 % Final  . MCV 02/08/2015 79.4* 80.0 - 100.0 fL Final  . MCH 02/08/2015 25.3* 26.0 - 34.0 pg Final  . MCHC 02/08/2015 31.8* 32.0 - 36.0 g/dL Final  . RDW 02/08/2015 13.7  11.5 - 14.5 % Final  . Platelets 02/08/2015 182  150 - 440 K/uL Final  . Neutrophils Relative %  02/08/2015 63   Final  . Neutro Abs 02/08/2015 6.2  1.4 - 6.5 K/uL Final  . Lymphocytes Relative 02/08/2015 31   Final  . Lymphs Abs 02/08/2015 3.1  1.0 - 3.6 K/uL Final  . Monocytes Relative 02/08/2015 4   Final  . Monocytes Absolute 02/08/2015 0.4  0.2 - 0.9 K/uL Final  . Eosinophils Relative 02/08/2015 1   Final  . Eosinophils Absolute 02/08/2015 0.1  0 - 0.7 K/uL Final  . Basophils Relative 02/08/2015 1   Final  . Basophils Absolute 02/08/2015 0.1  0 - 0.1 K/uL Final  . Sodium 02/08/2015 136  135 - 145 mmol/L Final  . Potassium 02/08/2015 4.1  3.5 - 5.1 mmol/L Final  . Chloride 02/08/2015 105  101 - 111 mmol/L Final  . CO2 02/08/2015 27  22 - 32 mmol/L Final  . Glucose, Bld 02/08/2015 81  65 - 99 mg/dL Final  . BUN 02/08/2015 12  6 - 20 mg/dL Final  .  Creatinine, Ser 02/08/2015 0.41* 0.44 - 1.00 mg/dL Final  . Calcium 02/08/2015 8.6* 8.9 - 10.3 mg/dL Final  . Total Protein 02/08/2015 5.6* 6.5 - 8.1 g/dL Final  . Albumin 02/08/2015 3.6  3.5 - 5.0 g/dL Final  . AST 02/08/2015 15  15 - 41 U/L Final  . ALT 02/08/2015 15  14 - 54 U/L Final  . Alkaline Phosphatase 02/08/2015 47  38 - 126 U/L Final  . Total Bilirubin 02/08/2015 0.2* 0.3 - 1.2 mg/dL Final  . GFR calc non Af Amer 02/08/2015 >60  >60 mL/min Final  . GFR calc Af Amer 02/08/2015 >60  >60 mL/min Final   Comment: (NOTE) The eGFR has been calculated using the CKD EPI equation. This calculation has not been validated in all clinical situations. eGFR's persistently <60 mL/min signify possible Chronic Kidney Disease.   . Anion gap 02/08/2015 4* 5 - 15 Final  . Sed Rate 02/08/2015 4  0 - 20 mm/hr Final  . Total Protein ELP 02/08/2015 5.3* 6.0 - 8.5 g/dL Final  . Albumin ELP 02/08/2015 3.4  3.2 - 5.6 g/dL Final   Comment: (NOTE) **Effective February 19, 2015 the reference interval for**  Albumin will be changing to:                        0 - 30 days         Not Estab.                           >30 days         2.9 - 4.4   .  Alpha-1-Globulin 02/08/2015 0.2  0.1 - 0.4 g/dL Final   Comment: (NOTE) **Effective February 19, 2015 the reference interval for**  Alpha-1-Globulin will be changing to:                        0 - 30 days         Not Estab.                           >30 days         0.0 - 0.4   . Alpha-2-Globulin 02/08/2015 0.6  0.4 - 1.2 g/dL Final   Comment: (NOTE) **Effective February 19, 2015 the reference interval for**  Alpha-2-Globulin will be changing to:                        0 - 30 days         Not Estab.                           >30 days         0.4 - 1.0   . Beta Globulin 02/08/2015 0.8  0.6 - 1.3 g/dL Final   Comment: (NOTE) **Effective February 19, 2015 the reference interval for**  Beta Globulin will be changing to:                        0 - 30 days         Not Estab.                        1 -  6 months  0.5 - 1.3                            >6 months       0.7 - 1.3   . Gamma Globulin 02/08/2015 0.2* 0.5 - 1.6 g/dL Final   Comment: (NOTE) **Effective February 19, 2015 the reference interval for**  Gamma Globulin will be changing to:                        0 - 30 days         Not Estab.                        1 -  6 months       0.3 - 1.6                 7 months -  5 years        0.4 - 1.3                        6 - 17 years        0.6 - 1.5                           >17 years        0.4 - 1.8   . M-Spike, % 02/08/2015 Not Observed  Not Observed g/dL Final  . SPE Interp. 02/08/2015 Comment   Final   Comment: (NOTE) The SPE pattern reflects virtual absence of gamma globulin which describes agammaglobulinemia. Serum immunoelectrophoresis and examination of the urine for Bence Jones protein may be indicated if warranted by the clinical picture. Performed At: [facility] [place], [place] [id number] Lindon Romp MD DJ:[id number]   . Comment 02/08/2015 Comment   Final   Comment: (NOTE) Protein electrophoresis scan will follow via computer, mail, or courier  delivery.   Marland Kitchen GLOBULIN, TOTAL 02/08/2015 1.9* 2.0 - 4.5 g/dL Corrected   Comment: (NOTE) **Effective February 19, 2015 the reference interval for**  Globulin, Total will be changing to:                        0 - 30 days         Not Estab.                           >30 days         2.2 - 3.9   . A/G Ratio 02/08/2015 1.8  0.7 - 2.0 Corrected   Comment: (NOTE) **Effective February 19, 2015 the reference interval for**  A/G Ratio will be changing to:                        0 - 30 days         Not Estab.                           >30 days         0.7 - 1.7   . IgG (Immunoglobin G), Serum 02/08/2015 171* 700 - 1600 mg/dL Final  . IgA 02/08/2015 48* 87 - 352 mg/dL Final  . IgM, Serum 02/08/2015 44  26 - 217  mg/dL Final   Comment: (NOTE) Performed At: [facility] [place], [place] [id number] Lindon Romp MD [id number]   . IgE (Immunoglobulin E), Serum 02/08/2015 <1  0 - 100 IU/mL Final   Comment: (NOTE) Performed At: [facility] [place], [place] [id number] Lindon Romp MD ES:[id number]   . Color, Urine 02/08/2015 YELLOW* YELLOW Final  . APPearance 02/08/2015 CLEAR* CLEAR Final  . Glucose, UA 02/08/2015 NEGATIVE  NEGATIVE mg/dL Final  . Bilirubin Urine 02/08/2015 NEGATIVE  NEGATIVE Final  . Ketones, ur 02/08/2015 NEGATIVE  NEGATIVE mg/dL Final  . Specific Gravity, Urine 02/08/2015 1.016  1.005 - 1.030 Final  . Hgb urine dipstick 02/08/2015 NEGATIVE  NEGATIVE Final  . pH 02/08/2015 7.0  5.0 - 8.0 Final  . Protein, ur 02/08/2015 NEGATIVE  NEGATIVE mg/dL Final  . Nitrite 02/08/2015 NEGATIVE  NEGATIVE Final  . Leukocytes, UA 02/08/2015 NEGATIVE  NEGATIVE Final  . RBC / HPF 02/08/2015 0-5  0 - 5 RBC/hpf Final  . WBC, UA 02/08/2015 0-5  0 - 5 WBC/hpf Final  . Bacteria, UA 02/08/2015 NONE SEEN  NONE SEEN Final  . Squamous Epithelial / LPF 02/08/2015 0-5* NONE SEEN Final  . Mucous 02/08/2015 PRESENT   Final  Office Visit on 02/08/2015   Component Date Value Ref Range Status  . Compl, Total (CH50) 02/08/2015 56  42 - 60 U/mL Final   Comment: (NOTE) Performed At: [facility] [address] [place], [place] [id number] Lindon Romp MD BW:[id number]     Assessment:  ROSALIE BUENAVENTURA is an 24 y.o. female with a 5 year history abdominal symptoms suggestive of irritable bowel syndrome. During her GI workup, she was noted to have although a low IgG (198) and IgA (53).   She denies any recurrent infections except when she was pregnant. She has lost 15 pounds in the past few months. He has any fevers or sweats. She denies any bone pain. HIV testing was negative one year ago.  Exam reveals no adenopathy or hepatosplenomegaly.  Plan: 1. We reviewed her medical history, GI symptomatology, and available lab work. She has hypogammaglobulinemia of unclear etiology.   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 She has no evidence of nephrotic syndrome. She has no edema or hypoalbuminemia. No medications are implicated. She denies any toxin exposure.  This appears to be acquired rather than congenital as she has no history of recurrent infections.  I doubt that she has a monoclonal gammopathy.  Additional testing will be sent. The patient consented to HIV testing. 2. Labs today:  CBC with diff, CMP, ESR, SPEP, immunoglobulins (IgG, IgA, IgM, IgE), CH50, ANA, HIV testing. 3. Urinalysis- r/o proteinuria. 4. RTC to discuss results and direction of therapy.  Lequita Asal, MD  02/08/2015, 5:57 PM

## 2015-02-15 NOTE — Progress Notes (Signed)
Pt here today for follow up reagrding decreased IGA; offers no complaints today

## 2015-02-21 ENCOUNTER — Telehealth: Payer: Self-pay | Admitting: *Deleted

## 2015-02-21 NOTE — Telephone Encounter (Signed)
  Do you know anything about the status of the immunology consult?  M

## 2015-02-21 NOTE — Telephone Encounter (Signed)
Awaiting call back from referring office

## 2015-02-21 NOTE — Telephone Encounter (Signed)
Called and Advised awaiting call back from immunology office

## 2015-02-22 NOTE — Telephone Encounter (Signed)
  Can we refer to [facility]? I would be happy to talk to them.  Can you tell patient what is going on with the referral process?  M

## 2015-02-22 NOTE — Telephone Encounter (Signed)
Spoke with Luis Abed at [facility] [phone]; she stated that this clinic will not accept any out of county Medicaid patients at this time; suggested refer to [facility]; will discuss with Dr. Merlene Pulling on how to proceed; pt verbalized understanding of

## 2015-02-23 NOTE — Telephone Encounter (Signed)
Spoke with Erika Sanders at the [facility] Immunology clinic [phone] and faxed information for referral to her at [phone]; their office will contact the pt for appointment; pt notified and verbalized understanding of

## 2015-02-26 ENCOUNTER — Encounter: Payer: Self-pay | Admitting: Hematology and Oncology

## 2015-02-26 NOTE — Progress Notes (Signed)
[facility] day:  02/15/2015  Chief Complaint: Erika Sanders is a 24 y.o. female with a low IgA and IgG who is seen for review of interval studies and discussion regarding direction of therapy.  HPI: The patient was last seen in the medical oncology clinic on 02/08/2015.  At that time,  She was seen for initial consultation regarding a low IgA. She had a five-year history of abdominal symptoms suggestive of interval irritable bowel. Denies any recurrent infections except with her pregnancy. She lost 15 pounds in the past few months she had any fevers or sweats. Exam revealed no adenopathy or hepatosplenomegaly.   She underwent a workup. CBC revealed a hematocrit 37.4, hemoglobin 11.9, MCV 79.4 (slightly low), platelets 182,000, white count 9800 with an ANC of 6200. Differential was unremarkable. Comprehensive metabolic panel included an albumin of 3.6 and a creatinine of 0.41. Sedimentation rate was 4. Serum protein electrophoresis revealed no monoclonal protein. IgG was 171 ([phone]) , IgA 48 (87-352), IgM 44 (26-2 17) and IgE less than 1.  CH50 was 56 (42-60).  HIV testing was negative. Urinalysis revealed no protein.  Symptomatically, she feels about the same.  Past Medical History  Diagnosis Date  . Anxiety     Past Surgical History  Procedure Laterality Date  . Appendectomy    . Tonsillectomy    . Wisdom tooth extraction      Family History  Problem Relation Age of Onset  . Hypertension Mother   . Hypercholesterolemia Mother   . Hypertension Father   . Hypercholesterolemia Father   . Stroke Paternal Aunt   . Aneurysm Other     Social History:  reports that she has been smoking Cigarettes.  She has been smoking about 0.50 packs per day. She has never used smokeless tobacco. She reports that she does not drink alcohol or use illicit drugs.   Allergies:  Allergies  Allergen Reactions  . Lactase Nausea And Vomiting  . Other  Other (See Comments)    Grass  . Gramineae Pollens Rash  . Lactose Nausea Only    Current Medications: Current Outpatient Prescriptions  Medication Sig Dispense Refill  . etonogestrel (NEXPLANON) 68 MG IMPL implant 68 mg by Implant route continuous.    . hydrOXYzine (ATARAX/VISTARIL) 25 MG tablet Take 25 mg by mouth every 8 (eight) hours as needed.     No current facility-administered medications for this visit.    Review of Systems:  GENERAL: Weakness and fatigue. No fevers or sweats. Weight loss of 15# in the past few months.. PERFORMANCE STATUS (ECOG): 1-2 HEENT: Blurred vision off/on x 2 years. No runny nose, sore throat, mouth sores or tenderness. Lungs: Minimal cough x 1 week. No shortness of breath. No hemoptysis. Cardiac: No chest pain, palpitations, orthopnea, or PND. GI: Early satiety. Intermittent nausea, vomiting, cramping, bloating, diarrhea alternating with constipation. No melena or hematochezia. GU: No urgency, frequency, dysuria, or hematuria. Musculoskeletal: No back pain. No joint pain. No muscle tenderness. Extremities: No pain or swelling. Skin: No rashes or skin changes. No after bath itching. Easy bruising x 6 months. Neuro: No headache, numbness or weakness, balance or coordination issues. Endocrine: No diabetes, thyroid issues, hot flashes or night sweats. Psych: Anxiety. No depression. Pain: No focal pain. Review of systems: All other systems reviewed and found to be negative.  Physical Exam: Blood pressure 103/70, pulse 90, temperature 98.5 F (36.9 C), temperature source Tympanic, height 5' 1"  (1.549 m), weight 114  lb 3.2 oz (51.8 kg). GENERAL:  Well developed, well nourished, sitting comfortably in the exam room in no acute distress. MENTAL STATUS:  Alert and oriented to person, place and time. HEAD:  Brown hair.  Normocephalic, atraumatic, face symmetric, no Cushingoid features. EYES:  Glasses.  Brown eyes.  No  conjunctivitis or scleral icterus. PSYCH:  Appropriate.   No visits with results within 3 Day(s) from this visit. Latest known visit with results is:  Appointment on 02/08/2015  Component Date Value Ref Range Status  . HIV Screen 4th Generation wRfx 02/08/2015 Non Reactive  Non Reactive Final   Comment: (NOTE) Performed At: [facility] [place], [place] [id number] Lindon Romp MD OA:[id number]   . WBC 02/08/2015 9.8  3.6 - 11.0 K/uL Final  . RBC 02/08/2015 4.72  3.80 - 5.20 MIL/uL Final  . Hemoglobin 02/08/2015 11.9* 12.0 - 16.0 g/dL Final  . HCT 02/08/2015 37.4  35.0 - 47.0 % Final  . MCV 02/08/2015 79.4* 80.0 - 100.0 fL Final  . MCH 02/08/2015 25.3* 26.0 - 34.0 pg Final  . MCHC 02/08/2015 31.8* 32.0 - 36.0 g/dL Final  . RDW 02/08/2015 13.7  11.5 - 14.5 % Final  . Platelets 02/08/2015 182  150 - 440 K/uL Final  . Neutrophils Relative % 02/08/2015 63   Final  . Neutro Abs 02/08/2015 6.2  1.4 - 6.5 K/uL Final  . Lymphocytes Relative 02/08/2015 31   Final  . Lymphs Abs 02/08/2015 3.1  1.0 - 3.6 K/uL Final  . Monocytes Relative 02/08/2015 4   Final  . Monocytes Absolute 02/08/2015 0.4  0.2 - 0.9 K/uL Final  . Eosinophils Relative 02/08/2015 1   Final  . Eosinophils Absolute 02/08/2015 0.1  0 - 0.7 K/uL Final  . Basophils Relative 02/08/2015 1   Final  . Basophils Absolute 02/08/2015 0.1  0 - 0.1 K/uL Final  . Sodium 02/08/2015 136  135 - 145 mmol/L Final  . Potassium 02/08/2015 4.1  3.5 - 5.1 mmol/L Final  . Chloride 02/08/2015 105  101 - 111 mmol/L Final  . CO2 02/08/2015 27  22 - 32 mmol/L Final  . Glucose, Bld 02/08/2015 81  65 - 99 mg/dL Final  . BUN 02/08/2015 12  6 - 20 mg/dL Final  . Creatinine, Ser 02/08/2015 0.41* 0.44 - 1.00 mg/dL Final  . Calcium 02/08/2015 8.6* 8.9 - 10.3 mg/dL Final  . Total Protein 02/08/2015 5.6* 6.5 - 8.1 g/dL Final  . Albumin 02/08/2015 3.6  3.5 - 5.0 g/dL Final  . AST 02/08/2015 15  15 - 41 U/L Final  . ALT  02/08/2015 15  14 - 54 U/L Final  . Alkaline Phosphatase 02/08/2015 47  38 - 126 U/L Final  . Total Bilirubin 02/08/2015 0.2* 0.3 - 1.2 mg/dL Final  . GFR calc non Af Amer 02/08/2015 >60  >60 mL/min Final  . GFR calc Af Amer 02/08/2015 >60  >60 mL/min Final   Comment: (NOTE) The eGFR has been calculated using the CKD EPI equation. This calculation has not been validated in all clinical situations. eGFR's persistently <60 mL/min signify possible Chronic Kidney Disease.   . Anion gap 02/08/2015 4* 5 - 15 Final  . Sed Rate 02/08/2015 4  0 - 20 mm/hr Final  . Total Protein ELP 02/08/2015 5.3* 6.0 - 8.5 g/dL Final  . Albumin ELP 02/08/2015 3.4  3.2 - 5.6 g/dL Final   Comment: (NOTE) **Effective February 19, 2015 the reference interval for**  Albumin  will be changing to:                        0 - 30 days         Not Estab.                           >30 days         2.9 - 4.4   . Alpha-1-Globulin 02/08/2015 0.2  0.1 - 0.4 g/dL Final   Comment: (NOTE) **Effective February 19, 2015 the reference interval for**  Alpha-1-Globulin will be changing to:                        0 - 30 days         Not Estab.                           >30 days         0.0 - 0.4   . Alpha-2-Globulin 02/08/2015 0.6  0.4 - 1.2 g/dL Final   Comment: (NOTE) **Effective February 19, 2015 the reference interval for**  Alpha-2-Globulin will be changing to:                        0 - 30 days         Not Estab.                           >30 days         0.4 - 1.0   . Beta Globulin 02/08/2015 0.8  0.6 - 1.3 g/dL Final   Comment: (NOTE) **Effective February 19, 2015 the reference interval for**  Beta Globulin will be changing to:                        0 - 30 days         Not Estab.                        1 -  6 months       0.5 - 1.3                            >6 months       0.7 - 1.3   . Gamma Globulin 02/08/2015 0.2* 0.5 - 1.6 g/dL Final   Comment: (NOTE) **Effective February 19, 2015 the reference interval for**  Gamma Globulin  will be changing to:                        0 - 30 days         Not Estab.                        1 -  6 months       0.3 - 1.6                 7 months -  5 years        0.4 - 1.3                        6 - 17  years        0.6 - 1.5                           >17 years        0.4 - 1.8   . M-Spike, % 02/08/2015 Not Observed  Not Observed g/dL Final  . SPE Interp. 02/08/2015 Comment   Final   Comment: (NOTE) The SPE pattern reflects virtual absence of gamma globulin which describes agammaglobulinemia. Serum immunoelectrophoresis and examination of the urine for Bence Jones protein may be indicated if warranted by the clinical picture. Performed At: [facility] [place], [place] [id number] Lindon Romp MD BS:[id number]   . Comment 02/08/2015 Comment   Final   Comment: (NOTE) Protein electrophoresis scan will follow via computer, mail, or courier delivery.   Marland Kitchen GLOBULIN, TOTAL 02/08/2015 1.9* 2.0 - 4.5 g/dL Corrected   Comment: (NOTE) **Effective February 19, 2015 the reference interval for**  Globulin, Total will be changing to:                        0 - 30 days         Not Estab.                           >30 days         2.2 - 3.9   . A/G Ratio 02/08/2015 1.8  0.7 - 2.0 Corrected   Comment: (NOTE) **Effective February 19, 2015 the reference interval for**  A/G Ratio will be changing to:                        0 - 30 days         Not Estab.                           >30 days         0.7 - 1.7   . IgG (Immunoglobin G), Serum 02/08/2015 171* 700 - 1600 mg/dL Final  . IgA 02/08/2015 48* 87 - 352 mg/dL Final  . IgM, Serum 02/08/2015 44  26 - 217 mg/dL Final   Comment: (NOTE) Performed At: [facility] [place], [place] [id number] Lindon Romp MD WS:[id number]   . IgE (Immunoglobulin E), Serum 02/08/2015 <1  0 - 100 IU/mL Final   Comment: (NOTE) Performed At: [facility] [place], [place] [id number] Lindon Romp MD PR:[id number]   . Color, Urine 02/08/2015 YELLOW* YELLOW Final  . APPearance 02/08/2015 CLEAR* CLEAR Final  . Glucose, UA 02/08/2015 NEGATIVE  NEGATIVE mg/dL Final  . Bilirubin Urine 02/08/2015 NEGATIVE  NEGATIVE Final  . Ketones, ur 02/08/2015 NEGATIVE  NEGATIVE mg/dL Final  . Specific Gravity, Urine 02/08/2015 1.016  1.005 - 1.030 Final  . Hgb urine dipstick 02/08/2015 NEGATIVE  NEGATIVE Final  . pH 02/08/2015 7.0  5.0 - 8.0 Final  . Protein, ur 02/08/2015 NEGATIVE  NEGATIVE mg/dL Final  . Nitrite 02/08/2015 NEGATIVE  NEGATIVE Final  . Leukocytes, UA 02/08/2015 NEGATIVE  NEGATIVE Final  . RBC / HPF 02/08/2015 0-5  0 - 5 RBC/hpf Final  . WBC, UA 02/08/2015 0-5  0 - 5 WBC/hpf Final  . Bacteria, UA 02/08/2015 NONE SEEN  NONE SEEN Final  . Squamous Epithelial / LPF 02/08/2015  0-5* NONE SEEN Final  . Mucous 02/08/2015 PRESENT   Final    Assessment:  KESLEE HARRINGTON is a 24 y.o. female with a 5 year history abdominal symptoms suggestive of irritable bowel syndrome. During her GI workup, she was noted to have although a low IgG (198) and IgA (53). She denies any recurrent infections except when she was pregnant. She has lost 15 pounds in the past few months. He has any fevers or sweats. She denies any bone pain. HIV testing was negative one year ago.  Workup on 02/08/2015 revealed a hematocrit 37.4, hemoglobin 11.9, MCV 79.4 (slightly low), platelets 182,000, white count 9800 with an ANC of 6200. Differential was unremarkable. Comprehensive metabolic panel included an albumin of 3.6 and a creatinine of 0.41. Sedimentation rate was 4. Serum protein electrophoresis revealed no monoclonal protein. IgG was 171 [phone]) , IgA 48 (87-352), IgM 44 (26-2 17) and IgE less than 1.  CH50 was 56 (42-60).  HIV testing was negative. Urinalysis revealed no protein.  She has severe hypogammaglobulinemia/agammglobulinemia.  The etiology is unclear.  Exam reveals no adenopathy or  hepatosplenomegaly.  Plan: 1. Review work-up and diagnosis of hypogammaglobulinemia.  Discuss no evidence of malignancy or abnormal protein. 2. Discuss referral to [facility] or [facility] immunology. 3. RTC prn.  Lequita Asal, MD

## 2015-03-19 NOTE — Telephone Encounter (Signed)
  I will take it off my list.  Thanks  M

## 2015-03-19 NOTE — Telephone Encounter (Signed)
  Did the immunology consult ever happen?  M

## 2015-03-19 NOTE — Telephone Encounter (Signed)
Please see my note from 6/17: [facility] with [facility] [phone] and faxed information for referral to her at [phone]; their office will contact the pt for appointment; pt notified and verbalized understanding of

## 2015-05-04 ENCOUNTER — Encounter: Payer: Self-pay | Admitting: *Deleted

## 2015-05-07 ENCOUNTER — Ambulatory Visit
Admission: RE | Admit: 2015-05-07 | Discharge: 2015-05-07 | Disposition: A | Discharge status: Home / Self Care | Payer: Medicaid Other | Source: Ambulatory Visit | Attending: Unknown Physician Specialty | Admitting: Unknown Physician Specialty

## 2015-05-07 ENCOUNTER — Ambulatory Visit: Payer: Medicaid Other | Admitting: Anesthesiology

## 2015-05-07 ENCOUNTER — Encounter
Admission: RE | Disposition: A | Discharge status: Home / Self Care | Payer: Self-pay | Source: Ambulatory Visit | Attending: Unknown Physician Specialty

## 2015-05-07 ENCOUNTER — Encounter: Payer: Self-pay | Admitting: *Deleted

## 2015-05-07 DIAGNOSIS — D649 Anemia, unspecified: Secondary | ICD-10-CM | POA: Insufficient documentation

## 2015-05-07 DIAGNOSIS — R197 Diarrhea, unspecified: Secondary | ICD-10-CM | POA: Insufficient documentation

## 2015-05-07 DIAGNOSIS — K219 Gastro-esophageal reflux disease without esophagitis: Secondary | ICD-10-CM | POA: Diagnosis not present

## 2015-05-07 DIAGNOSIS — K2950 Unspecified chronic gastritis without bleeding: Secondary | ICD-10-CM | POA: Diagnosis not present

## 2015-05-07 DIAGNOSIS — R1084 Generalized abdominal pain: Secondary | ICD-10-CM | POA: Insufficient documentation

## 2015-05-07 DIAGNOSIS — B9681 Helicobacter pylori [H. pylori] as the cause of diseases classified elsewhere: Secondary | ICD-10-CM | POA: Insufficient documentation

## 2015-05-07 DIAGNOSIS — R51 Headache: Secondary | ICD-10-CM | POA: Insufficient documentation

## 2015-05-07 DIAGNOSIS — R194 Change in bowel habit: Secondary | ICD-10-CM | POA: Diagnosis not present

## 2015-05-07 DIAGNOSIS — F419 Anxiety disorder, unspecified: Secondary | ICD-10-CM | POA: Diagnosis not present

## 2015-05-07 DIAGNOSIS — F17210 Nicotine dependence, cigarettes, uncomplicated: Secondary | ICD-10-CM | POA: Insufficient documentation

## 2015-05-07 DIAGNOSIS — K3189 Other diseases of stomach and duodenum: Secondary | ICD-10-CM | POA: Insufficient documentation

## 2015-05-07 DIAGNOSIS — F329 Major depressive disorder, single episode, unspecified: Secondary | ICD-10-CM | POA: Insufficient documentation

## 2015-05-07 DIAGNOSIS — K2900 Acute gastritis without bleeding: Secondary | ICD-10-CM | POA: Insufficient documentation

## 2015-05-07 DIAGNOSIS — Z91011 Allergy to milk products: Secondary | ICD-10-CM | POA: Insufficient documentation

## 2015-05-07 DIAGNOSIS — Z888 Allergy status to other drugs, medicaments and biological substances status: Secondary | ICD-10-CM | POA: Insufficient documentation

## 2015-05-07 HISTORY — DX: Anemia, unspecified: D64.9

## 2015-05-07 HISTORY — DX: Other specified symptoms and signs involving the digestive system and abdomen: R19.8

## 2015-05-07 HISTORY — DX: Depression, unspecified: F32.A

## 2015-05-07 HISTORY — PX: COLONOSCOPY WITH PROPOFOL: SHX5780

## 2015-05-07 HISTORY — DX: Major depressive disorder, single episode, unspecified: F32.9

## 2015-05-07 HISTORY — PX: ESOPHAGOGASTRODUODENOSCOPY (EGD) WITH PROPOFOL: SHX5813

## 2015-05-07 LAB — POCT PREGNANCY, URINE: Preg Test, Ur: NEGATIVE

## 2015-05-07 SURGERY — ESOPHAGOGASTRODUODENOSCOPY (EGD) WITH PROPOFOL
Anesthesia: General

## 2015-05-07 MED ORDER — GLYCOPYRROLATE 0.2 MG/ML IJ SOLN
INTRAMUSCULAR | Status: DC | PRN
  Administered 2015-05-07: 0.3 mg via INTRAVENOUS

## 2015-05-07 MED ORDER — PHENYLEPHRINE HCL 10 MG/ML IJ SOLN
INTRAMUSCULAR | Status: DC | PRN
  Administered 2015-05-07: 100 ug via INTRAVENOUS

## 2015-05-07 MED ORDER — PROPOFOL INFUSION 10 MG/ML OPTIME
INTRAVENOUS | Status: DC | PRN
  Administered 2015-05-07: 180 ug/kg/min via INTRAVENOUS

## 2015-05-07 MED ORDER — PROPOFOL 10 MG/ML IV BOLUS
INTRAVENOUS | Status: DC | PRN
  Administered 2015-05-07: 30 mg via INTRAVENOUS
  Administered 2015-05-07: 70 mg via INTRAVENOUS
  Administered 2015-05-07: 40 mg via INTRAVENOUS

## 2015-05-07 MED ORDER — LIDOCAINE HCL (CARDIAC) 20 MG/ML IV SOLN
INTRAVENOUS | Status: DC | PRN
  Administered 2015-05-07: 100 mg via INTRAVENOUS

## 2015-05-07 MED ORDER — SODIUM CHLORIDE 0.9 % IV SOLN
INTRAVENOUS | Status: DC
  Administered 2015-05-07: 16:00:00 via INTRAVENOUS

## 2015-05-07 MED ORDER — SODIUM CHLORIDE 0.9 % IV SOLN: INTRAVENOUS | Status: DC

## 2015-05-07 MED ORDER — MIDAZOLAM HCL 2 MG/2ML IJ SOLN
INTRAMUSCULAR | Status: DC | PRN
  Administered 2015-05-07: 2 mg via INTRAVENOUS

## 2015-05-07 NOTE — Op Note (Signed)
[facility] Gastroenterology Patient Name: Erika Sanders Procedure Date: 05/07/2015 4:01 PM MRN: [id number] Account #: [id number] Date of Birth: [birth year] Admit Type: Outpatient Age: 24 Room: [facility] ENDO ROOM 1 Gender: Female Note Status: Finalized Procedure:         Colonoscopy Indications:       Generalized abdominal pain, Change in bowel habits Providers:         Scot Jun, MD Referring MD:      No Local Md, MD (Referring MD) Medicines:         Propofol per Anesthesia Complications:     No immediate complications. Procedure:         Pre-Anesthesia Assessment:                    - After reviewing the risks and benefits, the patient was                     deemed in satisfactory condition to undergo the procedure.                    After obtaining informed consent, the colonoscope was                     passed under direct vision. Throughout the procedure, the                     patient's blood pressure, pulse, and oxygen saturations                     were monitored continuously. The Colonoscope was                     introduced through the anus and advanced to the the cecum,                     identified by appendiceal orifice and ileocecal valve. The                     colonoscopy was performed without difficulty. The patient                     tolerated the procedure well. The quality of the bowel                     preparation was good. Findings:      Anal sphincter area very weak with very little sphincter tone.      Prep was good, no polyps or inflammation. Terminal ileum was normal.      The exam was otherwise without abnormality. Impression:        - The examination was otherwise normal.                    - No specimens collected. Recommendation:    - The findings and recommendations were discussed with the                     patient's family. Scot Jun, MD 05/07/2015 4:37:34 PM This report has been signed  electronically. Number of Addenda: 0 Note Initiated On: 05/07/2015 4:01 PM Scope Withdrawal Time: 0 hours 6 minutes 15 seconds  Total Procedure Duration: 0 hours 10 minutes 9 seconds       [facility]

## 2015-05-07 NOTE — Op Note (Signed)
[facility] Gastroenterology Patient Name: Erika Sanders Procedure Date: 05/07/2015 4:04 PM MRN: [id number] Account #: [id number] Date of Birth: [birth year] Admit Type: Outpatient Age: 24 Room: [facility] ENDO ROOM 1 Gender: Female Note Status: Finalized Procedure:         Upper GI endoscopy Indications:       Generalized abdominal pain Providers:         Scot Jun, MD Referring MD:      No Local Md, MD (Referring MD) Medicines:         Propofol per Anesthesia Complications:     No immediate complications. Procedure:         Pre-Anesthesia Assessment:                    - After reviewing the risks and benefits, the patient was                     deemed in satisfactory condition to undergo the procedure.                    After obtaining informed consent, the endoscope was passed                     under direct vision. Throughout the procedure, the                     patient's blood pressure, pulse, and oxygen saturations                     were monitored continuously. The Endoscope was introduced                     through the mouth, and advanced to the second part of                     duodenum. The upper GI endoscopy was accomplished without                     difficulty. The patient tolerated the procedure well. Findings:      The examined esophagus was normal. GEJ 40cm.      A few dispersed, small non-bleeding erosions were found in the gastric       body. There were no stigmata of recent bleeding.      Diffuse and patchy moderate inflammation characterized by erythema and       granularity was found in the gastric body. Biopsies were taken with a       cold forceps for histology. Biopsies were taken with a cold forceps for       Helicobacter pylori testing.      The examined duodenum was normal. Impression:        - Normal esophagus.                    - Non-bleeding erosive gastropathy.                    - Gastritis. Biopsied.     - Normal examined duodenum. Recommendation:    - Await pathology results. Scot Jun, MD 05/07/2015 4:22:24 PM This report has been signed electronically. Number of Addenda: 0 Note Initiated On: 05/07/2015 4:04 PM      [facility]

## 2015-05-07 NOTE — Transfer of Care (Signed)
Immediate Anesthesia Transfer of Care Note  Patient: Erika Sanders  Procedure(s) Performed: Procedure(s): ESOPHAGOGASTRODUODENOSCOPY (EGD) WITH PROPOFOL (N/A) COLONOSCOPY WITH PROPOFOL (N/A)  Patient Location: PACU  Anesthesia Type:General  Level of Consciousness: awake, alert  and oriented  Airway & Oxygen Therapy: Patient Spontanous Breathing and Patient connected to nasal cannula oxygen  Post-op Assessment: Report given to RN and Post -op Vital signs reviewed and stable  Post vital signs: Reviewed and stable  Last Vitals:  Filed Vitals:   05/07/15 1641  BP: 101/65  Pulse: 91  Temp: 36.6 C  Resp: 16    Complications: No apparent anesthesia complications

## 2015-05-07 NOTE — H&P (Signed)
   Primary Care Physician:  Binnie Kand, MD Primary Gastroenterologist:  Dr. Mechele Collin  Pre-Procedure History & Physical: HPI:  Erika Sanders is a 24 y.o. female is here for an endoscopy and colonoscopy.   Past Medical History  Diagnosis Date  . Anxiety   . Depression   . Anemia   . Alternating constipation and diarrhea     Past Surgical History  Procedure Laterality Date  . Appendectomy    . Tonsillectomy    . Wisdom tooth extraction    . Adenoidnectomy Bilateral     Prior to Admission medications   Medication Sig Start Date End Date Taking? Authorizing Provider  OMEPRAZOLE MAGNESIUM PO Take 20 mg by mouth.   Yes Historical Provider, MD  etonogestrel (NEXPLANON) 68 MG IMPL implant 68 mg by Implant route continuous. 07/21/14   Historical Provider, MD  hydrOXYzine (ATARAX/VISTARIL) 25 MG tablet Take 25 mg by mouth every 8 (eight) hours as needed. 08/18/14   Historical Provider, MD    Allergies as of 04/07/2015 - Review Complete 02/15/2015  Allergen Reaction Noted  . Lactase Nausea And Vomiting 02/08/2015  . Other Other (See Comments) 02/08/2015  . Gramineae pollens Rash 02/08/2015  . Lactose Nausea Only 02/08/2015    Family History  Problem Relation Age of Onset  . Hypertension Mother   . Hypercholesterolemia Mother   . Hypertension Father   . Hypercholesterolemia Father   . Stroke Paternal Aunt   . Aneurysm Other     Social History   Social History  . Marital Status: Legally Separated    Spouse Name: N/A  . Number of Children: N/A  . Years of Education: N/A   Occupational History  . Not on file.   Social History Main Topics  . Smoking status: Current Every Day Smoker -- 0.50 packs/day    Types: Cigarettes  . Smokeless tobacco: Never Used  . Alcohol Use: No  . Drug Use: No  . Sexual Activity: Yes    Birth Control/ Protection: Implant   Other Topics Concern  . Not on file   Social History Narrative    Review of Systems: See HPI,  otherwise negative ROS  Physical Exam: BP 111/71 mmHg  Pulse 112  Temp(Src) 98.5 F (36.9 C) (Oral)  Resp 18  Ht  (1.549 m)  Wt 52.164 kg (115 lb)  BMI 21.74 kg/m2  SpO2 100%  LMP 04/15/2015 (Exact Date) General:   Alert,  pleasant and cooperative in NAD Head:  Normocephalic and atraumatic. Neck:  Supple; no masses or thyromegaly. Lungs:  Clear throughout to auscultation.    Heart:  Regular rate and rhythm. Abdomen:  Soft, nontender and nondistended. Normal bowel sounds, without guarding, and without rebound.   Neurologic:  Alert and  oriented x4;  grossly normal neurologically.  Impression/Plan: Erika Sanders is here for an endoscopy and colonoscopy to be performed for generalized abdominal pain, vomiting, nausea.  Risks, benefits, limitations, and alternatives regarding  endoscopy and colonoscopy have been reviewed with the patient.  Questions have been answered.  All parties agreeable.   Lynnae Prude, MD  05/07/2015, 4:05 PM

## 2015-05-07 NOTE — Anesthesia Preprocedure Evaluation (Signed)
Anesthesia Evaluation  Patient identified by MRN, date of birth, ID band Patient awake    Reviewed: Allergy & Precautions, H&P , NPO status , Patient's Chart, lab work & pertinent test results, reviewed documented beta blocker date and time   History of Anesthesia Complications Negative for: history of anesthetic complications  Airway Mallampati: I  TM Distance: >3 FB Neck ROM: full    Dental no notable dental hx. (+) Teeth Intact   Pulmonary neg shortness of breath, neg sleep apnea, neg COPDRecent URI , Current Smoker,  breath sounds clear to auscultation  Pulmonary exam normal       Cardiovascular Exercise Tolerance: Good negative cardio ROS Normal cardiovascular examRhythm:regular Rate:Normal     Neuro/Psych  Headaches, PSYCHIATRIC DISORDERS (depression)    GI/Hepatic Neg liver ROS, GERD-  Medicated and Controlled,  Endo/Other  negative endocrine ROS  Renal/GU negative Renal ROS  negative genitourinary   Musculoskeletal   Abdominal   Peds  Hematology  (+) Blood dyscrasia, anemia ,   Anesthesia Other Findings Past Medical History:   Anxiety                                                      Depression                                                   Anemia                                                       Alternating constipation and diarrhea                        Reproductive/Obstetrics negative OB ROS                             Anesthesia Physical Anesthesia Plan  ASA: II  Anesthesia Plan: General   Post-op Pain Management:    Induction:   Airway Management Planned:   Additional Equipment:   Intra-op Plan:   Post-operative Plan:   Informed Consent: I have reviewed the patients History and Physical, chart, labs and discussed the procedure including the risks, benefits and alternatives for the proposed anesthesia with the patient or authorized representative who  has indicated his/her understanding and acceptance.   Dental Advisory Given  Plan Discussed with: Anesthesiologist, CRNA and Surgeon  Anesthesia Plan Comments:         Anesthesia Quick Evaluation

## 2015-05-08 ENCOUNTER — Encounter: Payer: Self-pay | Admitting: Unknown Physician Specialty

## 2015-05-09 LAB — SURGICAL PATHOLOGY

## 2015-05-09 NOTE — Anesthesia Postprocedure Evaluation (Signed)
  Anesthesia Post-op Note  Patient: Erika Sanders  Procedure(s) Performed: Procedure(s): ESOPHAGOGASTRODUODENOSCOPY (EGD) WITH PROPOFOL (N/A) COLONOSCOPY WITH PROPOFOL (N/A)  Anesthesia type:General  Patient location: PACU  Post pain: Pain level controlled  Post assessment: Post-op Vital signs reviewed, Patient's Cardiovascular Status Stable, Respiratory Function Stable, Patent Airway and No signs of Nausea or vomiting  Post vital signs: Reviewed and stable  Last Vitals:  Filed Vitals:   05/07/15 1641  BP: 101/65  Pulse: 91  Temp: 36.6 C  Resp: 16    Level of consciousness: awake, alert  and patient cooperative  Complications: No apparent anesthesia complications

## 2015-06-19 ENCOUNTER — Other Ambulatory Visit: Payer: Self-pay | Admitting: Otolaryngology

## 2015-06-19 DIAGNOSIS — R42 Dizziness and giddiness: Secondary | ICD-10-CM

## 2015-06-27 ENCOUNTER — Ambulatory Visit
Admission: RE | Admit: 2015-06-27 | Discharge: 2015-06-27 | Disposition: A | Discharge status: Home / Self Care | Payer: Medicaid Other | Source: Ambulatory Visit | Attending: Otolaryngology | Admitting: Otolaryngology

## 2015-06-27 DIAGNOSIS — R42 Dizziness and giddiness: Secondary | ICD-10-CM | POA: Insufficient documentation

## 2015-06-27 MED ORDER — GADOBENATE DIMEGLUMINE 529 MG/ML IV SOLN
10.0000 mL | Freq: Once | INTRAVENOUS | Status: AC | PRN
  Administered 2015-06-27: 10 mL via INTRAVENOUS

## 2016-01-02 ENCOUNTER — Emergency Department
Admission: EM | Admit: 2016-01-02 | Discharge: 2016-01-02 | Disposition: A | Discharge status: Home / Self Care | Payer: Medicaid Other | Attending: Emergency Medicine | Admitting: Emergency Medicine

## 2016-01-02 ENCOUNTER — Emergency Department: Payer: Medicaid Other

## 2016-01-02 DIAGNOSIS — N898 Other specified noninflammatory disorders of vagina: Secondary | ICD-10-CM | POA: Insufficient documentation

## 2016-01-02 DIAGNOSIS — F329 Major depressive disorder, single episode, unspecified: Secondary | ICD-10-CM | POA: Diagnosis not present

## 2016-01-02 DIAGNOSIS — F17210 Nicotine dependence, cigarettes, uncomplicated: Secondary | ICD-10-CM | POA: Insufficient documentation

## 2016-01-02 DIAGNOSIS — R102 Pelvic and perineal pain: Secondary | ICD-10-CM

## 2016-01-02 DIAGNOSIS — Z79899 Other long term (current) drug therapy: Secondary | ICD-10-CM | POA: Diagnosis not present

## 2016-01-02 LAB — URINALYSIS COMPLETE WITH MICROSCOPIC (ARMC ONLY)
Bacteria, UA: NONE SEEN
Bilirubin Urine: NEGATIVE
Glucose, UA: NEGATIVE mg/dL
Ketones, ur: NEGATIVE mg/dL
Leukocytes, UA: NEGATIVE
Nitrite: NEGATIVE
Protein, ur: NEGATIVE mg/dL
RBC / HPF: NONE SEEN RBC/hpf (ref 0–5)
Specific Gravity, Urine: 1.004 — ABNORMAL LOW (ref 1.005–1.030)
pH: 7 (ref 5.0–8.0)

## 2016-01-02 LAB — WET PREP, GENITAL
Clue Cells Wet Prep HPF POC: NONE SEEN
Sperm: NONE SEEN
Trich, Wet Prep: NONE SEEN
Yeast Wet Prep HPF POC: NONE SEEN

## 2016-01-02 LAB — CHLAMYDIA/NGC RT PCR (ARMC ONLY)
Chlamydia Tr: NOT DETECTED
Chlamydia Tr: NOT DETECTED
N gonorrhoeae: NOT DETECTED
N gonorrhoeae: NOT DETECTED

## 2016-01-02 LAB — POCT PREGNANCY, URINE: Preg Test, Ur: NEGATIVE

## 2016-01-02 NOTE — ED Notes (Signed)
Pt in with co white and bloody vaginal discharge since Saturday, no dysuria also co lower back pain.

## 2016-01-02 NOTE — ED Notes (Addendum)
Called lab to see when Chlamydia/NCG results would be ready. Lab said that it would be an hour since it takes that long to run those swabs. This RN asked why it would be that long if that's how long that particular swab takes when we collected it at 0132. Lab staff looked to see when they received it and they said they received it at 0145. She said that she wasn't sure why it hadn't been run earlier than now but it was running and it would be another hour before results are ready. Provider notified and family updated as patient is sleeping.

## 2016-01-02 NOTE — ED Notes (Signed)
Discharge instructions reviewed with patient. Patient verbalized understanding. Patient ambulated to lobby without difficulty.   

## 2016-01-02 NOTE — ED Notes (Signed)
Erika Sanders from lab called stating that the machine running the GC/chlamydia swab gave an error code and that they weren't able to get results for the test. She stated that this happens sometimes and they ran checks on the machine and it all checked out fine. Went on to say that they got some more sample from the swab and that it may be enough to get results from and they were running it now but they wouldn't have an answer as to whether it was enough to get results from for another hour and a half and that at that time we may need to collect another swab for them to run. Provider notified.

## 2016-01-02 NOTE — ED Provider Notes (Signed)
[facility] Emergency Department Provider Note  ____________________________________________  Time seen: 1:00AM  I have reviewed the triage vital signs and the nursing notes.   HISTORY  Chief Complaint Vaginal Discharge      HPI Erika Sanders is a 25 y.o. female with a history of anxiety depression and anemia, IUD presents with complaint of white and bloody vaginal discharge with onset 4 days ago. Patient denies any dysuria or fever. Patient admits to low back discomfort.    Past Medical History  Diagnosis Date  . Anxiety   . Depression   . Anemia   . Alternating constipation and diarrhea     Patient Active Problem List   Diagnosis Date Noted  . Abdominal pain, left lower quadrant 01/19/2015  . N&V (nausea and vomiting) 01/19/2015  . Abdominal pain, generalized 01/19/2015  . Alternating constipation and diarrhea 01/19/2015  . Anxiety 08/28/2014  . History of anemia 08/25/2014  . Headache, migraine 06/27/2014  . Numbness and tingling 05/09/2014  . Vision disturbance 05/09/2014    Past Surgical History  Procedure Laterality Date  . Appendectomy    . Tonsillectomy    . Wisdom tooth extraction    . Adenoidnectomy Bilateral   . Esophagogastroduodenoscopy (egd) with propofol N/A 05/07/2015    Procedure: ESOPHAGOGASTRODUODENOSCOPY (EGD) WITH PROPOFOL;  Surgeon: Scot Junobert T Elliott, MD;  [facility];  Service: Endoscopy;  Laterality: N/A;  . Colonoscopy with propofol N/A 05/07/2015    Procedure: COLONOSCOPY WITH PROPOFOL;  Surgeon: Scot Junobert T Elliott, MD;  [facility]COPY;  Service: Endoscopy;  Laterality: N/A;    Current Outpatient Rx  Name  Route  Sig  Dispense  Refill  . etonogestrel (NEXPLANON) 68 MG IMPL implant   Implant   68 mg by Implant route continuous.         . hydrOXYzine (ATARAX/VISTARIL) 25 MG tablet   Oral   Take 25 mg by mouth every 8 (eight) hours as needed.         Marland Kitchen. OMEPRAZOLE MAGNESIUM PO  Oral   Take 20 mg by mouth.           Allergies Lactase; Other; Gramineae pollens; and Lactose  Family History  Problem Relation Age of Onset  . Hypertension Mother   . Hypercholesterolemia Mother   . Hypertension Father   . Hypercholesterolemia Father   . Stroke Paternal Aunt   . Aneurysm Other     Social History Social History  Substance Use Topics  . Smoking status: Current Every Day Smoker -- 0.50 packs/day    Types: Cigarettes  . Smokeless tobacco: Never Used  . Alcohol Use: No    Review of Systems  Constitutional: Negative for fever. Eyes: Negative for visual changes. ENT: Negative for sore throat. Cardiovascular: Negative for chest pain. Respiratory: Negative for shortness of breath. Gastrointestinal: Negative for abdominal pain, vomiting and diarrhea. Genitourinary: Negative for dysuria.Positive for vaginal discharge Musculoskeletal: Negative for back pain. Skin: Negative for rash. Neurological: Negative for headaches, focal weakness or numbness.   10-point ROS otherwise negative.  ____________________________________________   PHYSICAL EXAM:  VITAL SIGNS: ED Triage Vitals  Enc Vitals Group     BP 01/02/16 0005 123/77 mmHg     Pulse Rate 01/02/16 0005 83     Resp 01/02/16 0005 18     Temp 01/02/16 0005 98.1 F (36.7 C)     Temp Source 01/02/16 0005 Oral     SpO2 01/02/16 0005 100 %     Weight 01/02/16 0005  120 lb (54.432 kg)     Height 01/02/16 0005  (1.549 m)     Head Cir --      Peak Flow --      Pain Score 01/02/16 0006 4     Pain Loc --      Pain Edu? --      Excl. in GC? --      Constitutional: Alert and oriented. Well appearing and in no distress. Eyes: Conjunctivae are normal. PERRL. Normal extraocular movements. ENT   Head: Normocephalic and atraumatic.   Nose: No congestion/rhinnorhea.   Mouth/Throat: Mucous membranes are moist.   Neck: No stridor. Hematological/Lymphatic/Immunilogical: No cervical  lymphadenopathy. Cardiovascular: Normal rate, regular rhythm. Normal and symmetric distal pulses are present in all extremities. No murmurs, rubs, or gallops. Respiratory: Normal respiratory effort without tachypnea nor retractions. Breath sounds are clear and equal bilaterally. No wheezes/rales/rhonchi. Gastrointestinal: Soft and nontender. No distention. There is no CVA tenderness. Genitourinary: Scant vaginal discharge noted white with blood tinged Musculoskeletal: Nontender with normal range of motion in all extremities. No joint effusions.  No lower extremity tenderness nor edema. Neurologic:  Normal speech and language. No gross focal neurologic deficits are appreciated. Speech is normal.  Skin:  Skin is warm, dry and intact. No rash noted. Psychiatric: Mood and affect are normal. Speech and behavior are normal. Patient exhibits appropriate insight and judgment.  ____________________________________________    LABS (pertinent positives/negatives)  Labs Reviewed  WET PREP, GENITAL - Abnormal; Notable for the following:    WBC, Wet Prep HPF POC MODERATE (*)    All other components within normal limits  URINALYSIS COMPLETEWITH MICROSCOPIC ([facility] ONLY) - Abnormal; Notable for the following:    Color, Urine STRAW (*)    APPearance CLEAR (*)    Specific Gravity, Urine 1.004 (*)    Hgb urine dipstick 1+ (*)    Squamous Epithelial / LPF 0-5 (*)    All other components within normal limits  CHLAMYDIA/NGC RT PCR ([facility] ONLY)  CHLAMYDIA/NGC RT PCR ([facility] ONLY)  POC URINE PREG, ED  POCT PREGNANCY, URINE        INITIAL IMPRESSION / ASSESSMENT AND PLAN / ED COURSE  Pertinent labs & imaging results that were available during my care of the patient were reviewed by me and considered in my medical decision making (see chart for details).  Samples were obtained at 1:30 AM, laboratory say that they received a sample at 1:45 AM however when the lab was contacted at 3:00  AM which would've allotted appropriate time for results to be obtained was informed that he would be an additional hour before results will be obtained. We were then notified by the lab staff that they received an error message regarding a sample and a such would be an additional hour and a half. I kept the patient and her partner informed of these findings. However to no falls of their own after meeting the emergency department for an extended period of time the elected to go home. I informed them that I would follow-up on the samples and if they were positive I would notify them immediately to return to the emergency department.  ____________________________________________   FINAL CLINICAL IMPRESSION(S) / ED DIAGNOSES  Final diagnoses:  Vaginal discharge      Darci Current, MD 01/02/16 [phone]

## 2016-01-03 ENCOUNTER — Telehealth: Payer: Self-pay | Admitting: Emergency Medicine

## 2016-01-03 NOTE — ED Notes (Signed)
Called patient back.  She wants std results.  Gave her results.

## 2016-01-26 ENCOUNTER — Encounter: Payer: Self-pay | Admitting: Emergency Medicine

## 2016-01-26 ENCOUNTER — Observation Stay
Admission: EM | Admit: 2016-01-26 | Discharge: 2016-01-28 | Disposition: A | Discharge status: Home / Self Care | Payer: Medicaid Other | Attending: Internal Medicine | Admitting: Internal Medicine

## 2016-01-26 DIAGNOSIS — E739 Lactose intolerance, unspecified: Secondary | ICD-10-CM | POA: Insufficient documentation

## 2016-01-26 DIAGNOSIS — Z823 Family history of stroke: Secondary | ICD-10-CM | POA: Diagnosis not present

## 2016-01-26 DIAGNOSIS — K5900 Constipation, unspecified: Secondary | ICD-10-CM | POA: Diagnosis not present

## 2016-01-26 DIAGNOSIS — F17210 Nicotine dependence, cigarettes, uncomplicated: Secondary | ICD-10-CM | POA: Insufficient documentation

## 2016-01-26 DIAGNOSIS — E860 Dehydration: Secondary | ICD-10-CM | POA: Diagnosis present

## 2016-01-26 DIAGNOSIS — Z8249 Family history of ischemic heart disease and other diseases of the circulatory system: Secondary | ICD-10-CM | POA: Insufficient documentation

## 2016-01-26 DIAGNOSIS — I951 Orthostatic hypotension: Secondary | ICD-10-CM | POA: Diagnosis present

## 2016-01-26 DIAGNOSIS — J301 Allergic rhinitis due to pollen: Secondary | ICD-10-CM | POA: Insufficient documentation

## 2016-01-26 DIAGNOSIS — G43909 Migraine, unspecified, not intractable, without status migrainosus: Secondary | ICD-10-CM | POA: Diagnosis not present

## 2016-01-26 DIAGNOSIS — F419 Anxiety disorder, unspecified: Secondary | ICD-10-CM | POA: Diagnosis not present

## 2016-01-26 DIAGNOSIS — F329 Major depressive disorder, single episode, unspecified: Secondary | ICD-10-CM | POA: Diagnosis not present

## 2016-01-26 DIAGNOSIS — D848 Other specified immunodeficiencies: Secondary | ICD-10-CM | POA: Diagnosis not present

## 2016-01-26 DIAGNOSIS — Z79899 Other long term (current) drug therapy: Secondary | ICD-10-CM | POA: Diagnosis not present

## 2016-01-26 DIAGNOSIS — Z9109 Other allergy status, other than to drugs and biological substances: Secondary | ICD-10-CM | POA: Diagnosis not present

## 2016-01-26 DIAGNOSIS — A084 Viral intestinal infection, unspecified: Secondary | ICD-10-CM | POA: Diagnosis not present

## 2016-01-26 DIAGNOSIS — D649 Anemia, unspecified: Secondary | ICD-10-CM | POA: Diagnosis not present

## 2016-01-26 DIAGNOSIS — R112 Nausea with vomiting, unspecified: Secondary | ICD-10-CM | POA: Diagnosis present

## 2016-01-26 HISTORY — DX: Immunodeficiency, unspecified: D84.9

## 2016-01-26 LAB — COMPREHENSIVE METABOLIC PANEL WITH GFR
ALT: 16 U/L (ref 14–54)
AST: 19 U/L (ref 15–41)
Albumin: 3.6 g/dL (ref 3.5–5.0)
Alkaline Phosphatase: 66 U/L (ref 38–126)
Anion gap: 6 (ref 5–15)
BUN: 10 mg/dL (ref 6–20)
CO2: 26 mmol/L (ref 22–32)
Calcium: 8.2 mg/dL — ABNORMAL LOW (ref 8.9–10.3)
Chloride: 105 mmol/L (ref 101–111)
Creatinine, Ser: 0.71 mg/dL (ref 0.44–1.00)
GFR calc Af Amer: >60 mL/min
GFR calc non Af Amer: >60 mL/min
Glucose, Bld: 97 mg/dL (ref 65–99)
Potassium: 3.6 mmol/L (ref 3.5–5.1)
Sodium: 137 mmol/L (ref 135–145)
Total Bilirubin: 0.8 mg/dL (ref 0.3–1.2)
Total Protein: 5.8 g/dL — ABNORMAL LOW (ref 6.5–8.1)

## 2016-01-26 LAB — URINALYSIS COMPLETE WITH MICROSCOPIC (ARMC ONLY)
Bilirubin Urine: NEGATIVE
Glucose, UA: NEGATIVE mg/dL
Hgb urine dipstick: NEGATIVE
Ketones, ur: NEGATIVE mg/dL
Nitrite: NEGATIVE
Protein, ur: NEGATIVE mg/dL
Specific Gravity, Urine: 1.014 (ref 1.005–1.030)
pH: 6 (ref 5.0–8.0)

## 2016-01-26 LAB — CBC
HCT: 36.9 % (ref 35.0–47.0)
Hemoglobin: 12.4 g/dL (ref 12.0–16.0)
MCH: 27.2 pg (ref 26.0–34.0)
MCHC: 33.7 g/dL (ref 32.0–36.0)
MCV: 80.9 fL (ref 80.0–100.0)
Platelets: 132 10*3/uL — ABNORMAL LOW (ref 150–440)
RBC: 4.56 MIL/uL (ref 3.80–5.20)
RDW: 13.2 % (ref 11.5–14.5)
WBC: 9 10*3/uL (ref 3.6–11.0)

## 2016-01-26 LAB — LIPASE, BLOOD: Lipase: 21 U/L (ref 11–51)

## 2016-01-26 LAB — POCT PREGNANCY, URINE: Preg Test, Ur: NEGATIVE

## 2016-01-26 MED ORDER — SODIUM CHLORIDE 0.9 % IV BOLUS (SEPSIS)
1000.0000 mL | Freq: Once | INTRAVENOUS | Status: AC
  Administered 2016-01-26: 1000 mL via INTRAVENOUS

## 2016-01-26 MED ORDER — ONDANSETRON HCL 4 MG/2ML IJ SOLN
4.0000 mg | Freq: Once | INTRAMUSCULAR | Status: AC | PRN
  Administered 2016-01-26: 4 mg via INTRAVENOUS
  Filled 2016-01-26: qty 2

## 2016-01-26 MED ORDER — ONDANSETRON HCL 4 MG PO TABS: 4.0000 mg | ORAL_TABLET | Freq: Three times a day (TID) | ORAL | Status: DC | PRN

## 2016-01-26 NOTE — ED Notes (Signed)
Notified MD Derrill KayGoodman of pt's orthostatic vitals. MD Derrill KayGoodman ordered 1000 mL bolus NaCl

## 2016-01-26 NOTE — ED Provider Notes (Signed)
[facility] Emergency Department Provider Note  ____________________________________________  Time seen: ~2125  I have reviewed the triage vital signs and the nursing notes.   HISTORY  Chief Complaint Emesis   History limited by: Not Limited   HPI Zachery Dauerlexandra B Mizell is a 25 y.o. female who presents to the emergency department today because of concerns for nausea and vomiting. The patient states she has had these symptoms for the past 4 days. She typically does have what she tries to eat. She has not had any associated abdominal pain. No bloody stool. No significant diarrhea. She did have a fever of 100.6 today. Today was the first day she had fever. She has not had any recent travel. No concerning abnormal ingestions. No known sick contacts. States this is happened to her once in the past.    Past Medical History  Diagnosis Date  . Anxiety   . Depression   . Anemia   . Alternating constipation and diarrhea   . Immune deficiency disorder [facility](HCC)     Patient Active Problem List   Diagnosis Date Noted  . Abdominal pain, left lower quadrant 01/19/2015  . N&V (nausea and vomiting) 01/19/2015  . Abdominal pain, generalized 01/19/2015  . Alternating constipation and diarrhea 01/19/2015  . Anxiety 08/28/2014  . History of anemia 08/25/2014  . Headache, migraine 06/27/2014  . Numbness and tingling 05/09/2014  . Vision disturbance 05/09/2014    Past Surgical History  Procedure Laterality Date  . Appendectomy    . Tonsillectomy    . Wisdom tooth extraction    . Adenoidnectomy Bilateral   . Esophagogastroduodenoscopy (egd) with propofol N/A 05/07/2015    Procedure: ESOPHAGOGASTRODUODENOSCOPY (EGD) WITH PROPOFOL;  Surgeon: Scot Junobert T Elliott, MD;  [facility];  Service: Endoscopy;  Laterality: N/A;  . Colonoscopy with propofol N/A 05/07/2015    Procedure: COLONOSCOPY WITH PROPOFOL;  Surgeon: Scot Junobert T Elliott, MD;  [facility];   Service: Endoscopy;  Laterality: N/A;    Current Outpatient Rx  Name  Route  Sig  Dispense  Refill  . etonogestrel (NEXPLANON) 68 MG IMPL implant   Implant   68 mg by Implant route continuous.         . hydrOXYzine (ATARAX/VISTARIL) 25 MG tablet   Oral   Take 25 mg by mouth every 8 (eight) hours as needed.         Marland Kitchen. OMEPRAZOLE MAGNESIUM PO   Oral   Take 20 mg by mouth.           Allergies Lactase; Other; Gramineae pollens; and Lactose  Family History  Problem Relation Age of Onset  . Hypertension Mother   . Hypercholesterolemia Mother   . Hypertension Father   . Hypercholesterolemia Father   . Stroke Paternal Aunt   . Aneurysm Other     Social History Social History  Substance Use Topics  . Smoking status: Current Every Day Smoker -- 0.50 packs/day    Types: Cigarettes  . Smokeless tobacco: Never Used  . Alcohol Use: No    Review of Systems  Constitutional: Negative for fever. Cardiovascular: Negative for chest pain. Respiratory: Negative for shortness of breath. Gastrointestinal: Negative for abdominal pain, vomiting and diarrhea. Neurological: Negative for headaches, focal weakness or numbness.  10-point ROS otherwise negative.  ____________________________________________   PHYSICAL EXAM:  VITAL SIGNS: ED Triage Vitals  Enc Vitals Group     BP 01/26/16 1814 93/57 mmHg     Pulse Rate 01/26/16 1814 117  Resp 01/26/16 1814 20     Temp 01/26/16 1814 99 F (37.2 C)     Temp src --      SpO2 01/26/16 1814 99 %     Weight 01/26/16 1814 120 lb (54.432 kg)     Height 01/26/16 1814  (1.549 m)     Head Cir --      Peak Flow --      Pain Score 01/26/16 1815 2   Constitutional: Alert and oriented. Well appearing and in no distress. Eyes: Conjunctivae are normal. PERRL. Normal extraocular movements. ENT   Head: Normocephalic and atraumatic.   Nose: No congestion/rhinnorhea.   Mouth/Throat: Mucous membranes are moist.   Neck:  No stridor. Hematological/Lymphatic/Immunilogical: No cervical lymphadenopathy. Cardiovascular: Normal rate, regular rhythm.  No murmurs, rubs, or gallops. Respiratory: Normal respiratory effort without tachypnea nor retractions. Breath sounds are clear and equal bilaterally. No wheezes/rales/rhonchi. Gastrointestinal: Soft and nontender. No distention.  Genitourinary: Deferred Musculoskeletal: Normal range of motion in all extremities. No joint effusions.  No lower extremity tenderness nor edema. Neurologic:  Normal speech and language. No gross focal neurologic deficits are appreciated.  Skin:  Skin is warm, dry and intact. No rash noted. Psychiatric: Mood and affect are normal. Speech and behavior are normal. Patient exhibits appropriate insight and judgment.  ____________________________________________    LABS (pertinent positives/negatives)  Labs Reviewed  COMPREHENSIVE METABOLIC PANEL - Abnormal; Notable for the following:    Calcium 8.2 (*)    Total Protein 5.8 (*)    All other components within normal limits  CBC - Abnormal; Notable for the following:    Platelets 132 (*)    All other components within normal limits  URINALYSIS COMPLETEWITH MICROSCOPIC ([facility] ONLY) - Abnormal; Notable for the following:    Color, Urine YELLOW (*)    APPearance CLEAR (*)    Leukocytes, UA TRACE (*)    Bacteria, UA RARE (*)    Squamous Epithelial / LPF 0-5 (*)    All other components within normal limits  LIPASE, BLOOD  POC URINE PREG, ED  POCT PREGNANCY, URINE     ____________________________________________   EKG  None  ____________________________________________    RADIOLOGY  None  ____________________________________________   PROCEDURES  Procedure(s) performed: None  Critical Care performed: No  ____________________________________________   INITIAL IMPRESSION / ASSESSMENT AND PLAN / ED COURSE  Pertinent labs & imaging results that were available during my  care of the patient were reviewed by me and considered in my medical decision making (see chart for details).  Patient presented to the emergency department today because of concerns for vomiting. On exam abdomen is soft and benign. No rebound. No guarding. Old work without any concerning leukocytosis. No UTI.  ----------------------------------------- 11:17 PM on 01/26/2016 -----------------------------------------  Patient remains orthostatic after 2 L. Will give another liter of fluids. If patient improves will likely be able to be discharged. Will prepare paperwork in the event that vital signs become more reassuring.  ____________________________________________   FINAL CLINICAL IMPRESSION(S) / ED DIAGNOSES  Final diagnoses:  Nausea and vomiting, vomiting of unspecified type     Note: This dictation was prepared with Dragon dictation. Any transcriptional errors that result from this process are unintentional    Phineas Semen, MD 01/26/16 2318

## 2016-01-26 NOTE — ED Notes (Signed)
Nausea, vomiting and diarrhea. Symptoms began 4 days ago. States thinks has been febrile.

## 2016-01-27 DIAGNOSIS — R112 Nausea with vomiting, unspecified: Secondary | ICD-10-CM | POA: Diagnosis present

## 2016-01-27 LAB — TSH: TSH: 1.271 u[IU]/mL (ref 0.350–4.500)

## 2016-01-27 LAB — HEMOGLOBIN A1C: Hgb A1c MFr Bld: 5 % (ref 4.0–6.0)

## 2016-01-27 MED ORDER — ONDANSETRON HCL 4 MG/2ML IJ SOLN
INTRAMUSCULAR | Status: AC
  Administered 2016-01-27: 4 mg via INTRAVENOUS
  Filled 2016-01-27: qty 2

## 2016-01-27 MED ORDER — HYDROXYZINE HCL 25 MG PO TABS: 25.0000 mg | ORAL_TABLET | Freq: Three times a day (TID) | ORAL | Status: DC | PRN

## 2016-01-27 MED ORDER — IMMUNE GLOBULIN (HUMAN) 10 GM/50ML ~~LOC~~ SOLN: 7.0000 g | SUBCUTANEOUS | Status: DC

## 2016-01-27 MED ORDER — SODIUM CHLORIDE 0.9 % IV BOLUS (SEPSIS)
1000.0000 mL | Freq: Once | INTRAVENOUS | Status: AC
  Administered 2016-01-27: 1000 mL via INTRAVENOUS

## 2016-01-27 MED ORDER — PANTOPRAZOLE SODIUM 40 MG PO TBEC
40.0000 mg | DELAYED_RELEASE_TABLET | Freq: Every day | ORAL | Status: DC
  Administered 2016-01-27: 40 mg via ORAL
  Filled 2016-01-27 (×2): qty 1

## 2016-01-27 MED ORDER — ONDANSETRON HCL 4 MG/2ML IJ SOLN
4.0000 mg | Freq: Once | INTRAMUSCULAR | Status: AC | PRN
  Administered 2016-01-27: 4 mg via INTRAVENOUS

## 2016-01-27 MED ORDER — MORPHINE SULFATE (PF) 2 MG/ML IV SOLN: 1.0000 mg | INTRAVENOUS | Status: DC | PRN

## 2016-01-27 MED ORDER — IMMUNE GLOBULIN (HUMAN) 10 GM/50ML ~~LOC~~ SOLN
7.0000 g | SUBCUTANEOUS | Status: DC
  Filled 2016-01-27: qty 7

## 2016-01-27 MED ORDER — LEVONORGESTREL 20 MCG/24HR IU IUD: 1.0000 | INTRAUTERINE_SYSTEM | Freq: Once | INTRAUTERINE | Status: DC

## 2016-01-27 MED ORDER — ACETAMINOPHEN 650 MG RE SUPP: 650.0000 mg | Freq: Four times a day (QID) | RECTAL | Status: DC | PRN

## 2016-01-27 MED ORDER — ACETAMINOPHEN 325 MG PO TABS: 650.0000 mg | ORAL_TABLET | Freq: Four times a day (QID) | ORAL | Status: DC | PRN

## 2016-01-27 MED ORDER — ONDANSETRON HCL 4 MG/2ML IJ SOLN: 4.0000 mg | Freq: Four times a day (QID) | INTRAMUSCULAR | Status: DC | PRN

## 2016-01-27 MED ORDER — IMMUNE GLOBULIN (HUMAN) 10 GM/50ML ~~LOC~~ SOLN
7.0000 g | SUBCUTANEOUS | Status: DC
  Administered 2016-01-27: 7 g via SUBCUTANEOUS
  Filled 2016-01-27: qty 7

## 2016-01-27 MED ORDER — ONDANSETRON HCL 4 MG PO TABS: 4.0000 mg | ORAL_TABLET | Freq: Four times a day (QID) | ORAL | Status: DC | PRN

## 2016-01-27 MED ORDER — SODIUM CHLORIDE 0.9 % IV SOLN
INTRAVENOUS | Status: DC
  Administered 2016-01-27 – 2016-01-28 (×4): via INTRAVENOUS

## 2016-01-27 MED ORDER — ENOXAPARIN SODIUM 40 MG/0.4ML ~~LOC~~ SOLN: 40.0000 mg | SUBCUTANEOUS | Status: DC

## 2016-01-27 NOTE — Progress Notes (Signed)
[facility] [place] [facility]   PATIENT NAME: Erika Sanders    MR#:  [id number]  DATE OF BIRTH:  14-Feb-1991  SUBJECTIVE:  CHIEF COMPLAINT:   Chief Complaint  Patient presents with  . Emesis  No longer having emesis, wanting to eat, parents at bedside REVIEW OF SYSTEMS:  Review of Systems  Constitutional: Negative for fever, weight loss, malaise/fatigue and diaphoresis.  HENT: Negative for ear discharge, ear pain, hearing loss, nosebleeds, sore throat and tinnitus.   Eyes: Negative for blurred vision and pain.  Respiratory: Negative for cough, hemoptysis, shortness of breath and wheezing.   Cardiovascular: Negative for chest pain, palpitations, orthopnea and leg swelling.  Gastrointestinal: Positive for nausea, vomiting, abdominal pain and diarrhea. Negative for heartburn, constipation and blood in stool.  Genitourinary: Negative for dysuria, urgency and frequency.  Musculoskeletal: Negative for myalgias and back pain.  Skin: Negative for itching and rash.  Neurological: Negative for dizziness, tingling, tremors, focal weakness, seizures, weakness and headaches.  Psychiatric/Behavioral: Negative for depression. The patient is not nervous/anxious.    DRUG ALLERGIES:   Allergies  Allergen Reactions  . Lactase Nausea And Vomiting  . Other Other (See Comments)    Grass  . Gramineae Pollens Rash  . Lactose Nausea Only   VITALS:  Blood pressure 91/52, pulse 61, temperature 98.4 F (36.9 C), temperature source Oral, resp. rate 17, height 5\' 1"  (1.549 m), weight 61.825 kg (136 lb 4.8 oz), last menstrual period 12/22/2015, SpO2 100 %. PHYSICAL EXAMINATION:  Physical Exam  Constitutional: She is oriented to person, place, and time and well-developed, well-nourished, and in no distress.  HENT:  Head: Normocephalic and atraumatic.  Eyes: Conjunctivae and EOM are normal. Pupils are equal, round, and reactive to light.  Neck: Normal range of motion.  Neck supple. No tracheal deviation present. No thyromegaly present.  Cardiovascular: Normal rate, regular rhythm and normal heart sounds.   Pulmonary/Chest: Effort normal and breath sounds normal. No respiratory distress. She has no wheezes. She exhibits no tenderness.  Abdominal: Soft. Bowel sounds are normal. She exhibits no distension. There is no tenderness.  Musculoskeletal: Normal range of motion.  Neurological: She is alert and oriented to person, place, and time. No cranial nerve deficit.  Skin: Skin is warm and dry. No rash noted.  Psychiatric: Mood and affect normal.   LABORATORY PANEL:   CBC  Recent Labs Lab 01/26/16 1825  WBC 9.0  HGB 12.4  HCT 36.9  PLT 132*   ------------------------------------------------------------------------------------------------------------------ Chemistries   Recent Labs Lab 01/26/16 1825  NA 137  K 3.6  CL 105  CO2 26  GLUCOSE 97  BUN 10  CREATININE 0.71  CALCIUM 8.2*  AST 19  ALT 16  ALKPHOS 66  BILITOT 0.8   RADIOLOGY:  No results found. ASSESSMENT AND PLAN:  This is a 25 year old female admitted for intractable nausea and vomiting and hypotension.  1. Intractable nausea and vomiting: Symptomatic management 2. Hypotension: Slowly improving with IV hydration 3. Primary immune deficiency disorder: The patient takes weekly immunoglobulin. Continue immunotherapy. No isolation precautions necessary. 4. DVT prophylaxis: Lovenox 5. GI prophylaxis: Pantoprazole     All the records are reviewed and case discussed with Care Management/Social Worker. Management plans discussed with the patient, family and they are in agreement.  CODE STATUS: FULL CODE  TOTAL TIME TAKING CARE OF THIS PATIENT:35 minutes.   More than 50% of the time was spent in counseling/coordination of care: YES  POSSIBLE D/C IN 1-2 DAYS, DEPENDING  ON CLINICAL CONDITION.   [facility], Havier Deeb M.D on 01/27/2016 at 2:26 PM  Between 7am to 6pm - Pager -  [phone]  After 6pm go to [email or web address] - password [email or web address] [facility]  [place] [facility]  Office  [phone]  CC: Primary care physician; Binnie Kand, MD  Note: This dictation was prepared with Dragon dictation along with smaller phrase technology. Any transcriptional errors that result from this process are unintentional.

## 2016-01-27 NOTE — ED Notes (Signed)
Pt given gingerale and crakers with MD Derrill KayGoodman approval

## 2016-01-27 NOTE — H&P (Signed)
Erika Sanders is an 25 y.o. female.   Chief Complaint: Nausea and vomiting HPI: The patient with past medical history of severe gastritis presents emergency department complaining of nausea and vomiting. She states that she has been unable to keep food down for at least 2 days. She denies sick contacts or unusual food. She admits these symptoms have occurred before prior to diagnosis with primary immunodeficiency disorder. Following 4 L of fluid in the emergency department the patient was still unable to keep down food. She was also hypotensive with systolic blood pressure in the mid 80s which prompted the emergency department staff to call for admission.  Past Medical History  Diagnosis Date  . Anxiety   . Depression   . Anemia   . Alternating constipation and diarrhea   . Immune deficiency disorder [facility]     Past Surgical History  Procedure Laterality Date  . Appendectomy    . Tonsillectomy    . Wisdom tooth extraction    . Adenoidnectomy Bilateral   . Esophagogastroduodenoscopy (egd) with propofol N/A 05/07/2015    Procedure: ESOPHAGOGASTRODUODENOSCOPY (EGD) WITH PROPOFOL;  Surgeon: Manya Silvas, MD;  Location: [facility] ENDOSCOPY;  Service: Endoscopy;  Laterality: N/A;  . Colonoscopy with propofol N/A 05/07/2015    Procedure: COLONOSCOPY WITH PROPOFOL;  Surgeon: Manya Silvas, MD;  Location: [facility] ENDOSCOPY;  Service: Endoscopy;  Laterality: N/A;    Family History  Problem Relation Age of Onset  . Hypertension Mother   . Hypercholesterolemia Mother   . Hypertension Father   . Hypercholesterolemia Father   . Stroke Paternal Aunt   . Aneurysm Other    Social History:  reports that she has been smoking Cigarettes.  She has been smoking about 0.50 packs per day. She has never used smokeless tobacco. She reports that she does not drink alcohol or use illicit drugs.  Allergies:  Allergies  Allergen Reactions  . Lactase Nausea And Vomiting  . Other Other (See Comments)   Grass  . Gramineae Pollens Rash  . Lactose Nausea Only    Prior to Admission medications   Medication Sig Start Date End Date Taking? Authorizing Provider  hydrOXYzine (ATARAX/VISTARIL) 25 MG tablet Take 25 mg by mouth every 8 (eight) hours as needed. 08/18/14  Yes Historical Provider, MD  Immune Globulin, Human, (HIZENTRA [place]) Inject 7 g into the skin every 7 (seven) days.   Yes Historical Provider, MD  levonorgestrel (MIRENA) 20 MCG/24HR IUD 1 each by Intrauterine route once.   Yes Historical Provider, MD  omeprazole (PRILOSEC) 20 MG capsule Take 20 mg by mouth daily as needed (acid reflux/heartburn).   Yes Historical Provider, MD  ondansetron (ZOFRAN) 4 MG tablet Take 1 tablet (4 mg total) by mouth every 8 (eight) hours as needed for nausea or vomiting. 01/26/16   Nance Pear, MD     Results for orders placed or performed during the hospital encounter of 01/26/16 (from the past 48 hour(s))  Urinalysis complete, with microscopic     Status: Abnormal   Collection Time: 01/26/16  6:04 PM  Result Value Ref Range   Color, Urine YELLOW (A) YELLOW   APPearance CLEAR (A) CLEAR   Glucose, UA NEGATIVE NEGATIVE mg/dL   Bilirubin Urine NEGATIVE NEGATIVE   Ketones, ur NEGATIVE NEGATIVE mg/dL   Specific Gravity, Urine 1.014 1.005 - 1.030   Hgb urine dipstick NEGATIVE NEGATIVE   pH 6.0 5.0 - 8.0   Protein, ur NEGATIVE NEGATIVE mg/dL   Nitrite NEGATIVE NEGATIVE   Leukocytes,  UA TRACE (A) NEGATIVE   RBC / HPF 0-5 0 - 5 RBC/hpf   WBC, UA 0-5 0 - 5 WBC/hpf   Bacteria, UA RARE (A) NONE SEEN   Squamous Epithelial / LPF 0-5 (A) NONE SEEN   Mucous PRESENT   Lipase, blood     Status: None   Collection Time: 01/26/16  6:25 PM  Result Value Ref Range   Lipase 21 11 - 51 U/L  Comprehensive metabolic panel     Status: Abnormal   Collection Time: 01/26/16  6:25 PM  Result Value Ref Range   Sodium 137 135 - 145 mmol/L   Potassium 3.6 3.5 - 5.1 mmol/L   Chloride 105 101 - 111 mmol/L   CO2 26 22 -  32 mmol/L   Glucose, Bld 97 65 - 99 mg/dL   BUN 10 6 - 20 mg/dL   Creatinine, Ser 0.71 0.44 - 1.00 mg/dL   Calcium 8.2 (L) 8.9 - 10.3 mg/dL   Total Protein 5.8 (L) 6.5 - 8.1 g/dL   Albumin 3.6 3.5 - 5.0 g/dL   AST 19 15 - 41 U/L   ALT 16 14 - 54 U/L   Alkaline Phosphatase 66 38 - 126 U/L   Total Bilirubin 0.8 0.3 - 1.2 mg/dL   GFR calc non Af Amer >60 >60 mL/min   GFR calc Af Amer >60 >60 mL/min    Comment: (NOTE) The eGFR has been calculated using the CKD EPI equation. This calculation has not been validated in all clinical situations. eGFR's persistently <60 mL/min signify possible Chronic Kidney Disease.    Anion gap 6 5 - 15  CBC     Status: Abnormal   Collection Time: 01/26/16  6:25 PM  Result Value Ref Range   WBC 9.0 3.6 - 11.0 K/uL   RBC 4.56 3.80 - 5.20 MIL/uL   Hemoglobin 12.4 12.0 - 16.0 g/dL   HCT 36.9 35.0 - 47.0 %   MCV 80.9 80.0 - 100.0 fL   MCH 27.2 26.0 - 34.0 pg   MCHC 33.7 32.0 - 36.0 g/dL   RDW 13.2 11.5 - 14.5 %   Platelets 132 (L) 150 - 440 K/uL  Pregnancy, urine POC     Status: None   Collection Time: 01/26/16  8:31 PM  Result Value Ref Range   Preg Test, Ur NEGATIVE NEGATIVE    Comment:        THE SENSITIVITY OF THIS METHODOLOGY IS >24 mIU/mL    No results found.  Review of Systems  Constitutional: Negative for fever and chills.  HENT: Negative for sore throat and tinnitus.   Eyes: Negative for blurred vision and redness.  Respiratory: Negative for cough and shortness of breath.   Cardiovascular: Negative for chest pain, palpitations, orthopnea and PND.  Gastrointestinal: Positive for nausea and vomiting. Negative for abdominal pain and diarrhea.  Genitourinary: Negative for dysuria, urgency and frequency.  Musculoskeletal: Negative for myalgias and joint pain.  Skin: Negative for rash.       No lesions  Neurological: Negative for speech change, focal weakness and weakness.  Endo/Heme/Allergies: Does not bruise/bleed easily.       No  temperature intolerance  Psychiatric/Behavioral: Negative for depression and suicidal ideas.    Blood pressure 95/61, pulse 96, temperature 99 F (37.2 C), resp. rate 16, height 5' 1"  (1.549 m), weight 54.432 kg (120 lb), last menstrual period 12/22/2015, SpO2 100 %. Physical Exam  Vitals reviewed. Constitutional: She is oriented to person, place, and  time. She appears well-developed and well-nourished. No distress.  HENT:  Head: Normocephalic and atraumatic.  Mouth/Throat: Oropharynx is clear and moist.  Eyes: Conjunctivae and EOM are normal. Pupils are equal, round, and reactive to light. No scleral icterus.  Neck: Normal range of motion. Neck supple. No JVD present. No tracheal deviation present. No thyromegaly present.  Cardiovascular: Normal rate, regular rhythm and normal heart sounds.  Exam reveals no gallop and no friction rub.   No murmur heard. Respiratory: Effort normal and breath sounds normal.  GI: Soft. Bowel sounds are normal. She exhibits no distension. There is no tenderness.  Genitourinary:  Deferred  Musculoskeletal: Normal range of motion. She exhibits no edema.  Lymphadenopathy:    She has no cervical adenopathy.  Neurological: She is alert and oriented to person, place, and time. No cranial nerve deficit. She exhibits normal muscle tone.  Skin: Skin is warm and dry. No rash noted. No erythema.  Psychiatric: She has a normal mood and affect. Her behavior is normal. Judgment and thought content normal.     Assessment/Plan This is a 25 year old female admitted for intractable nausea and vomiting and hypotension. 1. Intractable nausea and vomiting: The patient admits that the symptoms are usually how her primary immunodeficiency disorder presents. She has had severe gastritis in the past. Consult gastroenterology. We will continue to fluid resuscitate with intravenous saline and give supportive antiemetics as needed.  2. Hypotension: Asymptomatic; will gradually  improve fluid resuscitation. The patient reports that her normal blood pressure is close to 120/80.  3. Primary immune deficiency disorder: The patient takes weekly immunoglobulin. Continue immunotherapy. No isolation precautions necessary. 4. DVT prophylaxis: Lovenox 5. GI prophylaxis: Pantoprazole The patient is a full code. Time spent on admission orders and patient care approximately 45 minutes  Harrie Foreman, MD 01/27/2016, 4:25 AM

## 2016-01-27 NOTE — ED Provider Notes (Signed)
-----------------------------------------   3:13 AM on 01/27/2016 -----------------------------------------   Blood pressure 95/61, pulse 96, temperature 99 F (37.2 C), resp. rate 16, height 5\' 1"  (1.549 m), weight 120 lb (54.432 kg), last menstrual period 12/22/2015, SpO2 100 %.  Assuming care from Dr. Derrill KayGoodman.  In short, Erika Sanders is a 25 y.o. female with a chief complaint of Emesis .  Refer to the original H&P for additional details.  The current plan of care is to reassess the patient after her normal saline.  The patient continued to have orthostasis as well as hypotension after her normal saline. I did order and give her a fourth liter and again it was persistent. The patient reports that she feels okay and she is not nauseous or vomiting at this time but I feel she needs more hydration. The patient will be admitted to the hospitalist service for further IV fluid hydration.   Rebecka ApleyAllison P Cheng Dec, MD 01/27/16 [phone]

## 2016-01-27 NOTE — Progress Notes (Signed)
MD notified of Blood pressure - fluids infusing 125 hr - will continue to monitor. OK to shower.

## 2016-01-27 NOTE — Care Management Important Message (Signed)
Important Message  Patient Details  Name: Erika Sanders MRN: [id number] Date of Birth: May 14, 1991   Medicare Important Message Given:  No (Medicaid only)    Codee Bloodworth A, RN 01/27/2016, 3:48 PM

## 2016-01-27 NOTE — ED Notes (Signed)
Pt reports increase in nausea.  

## 2016-01-28 LAB — CBC
HCT: 28.8 % — ABNORMAL LOW (ref 35.0–47.0)
Hemoglobin: 9.6 g/dL — ABNORMAL LOW (ref 12.0–16.0)
MCH: 26.5 pg (ref 26.0–34.0)
MCHC: 33.4 g/dL (ref 32.0–36.0)
MCV: 79.4 fL — ABNORMAL LOW (ref 80.0–100.0)
Platelets: 112 10*3/uL — ABNORMAL LOW (ref 150–440)
RBC: 3.62 MIL/uL — ABNORMAL LOW (ref 3.80–5.20)
RDW: 13.4 % (ref 11.5–14.5)
WBC: 4.8 10*3/uL (ref 3.6–11.0)

## 2016-01-28 LAB — BASIC METABOLIC PANEL
Anion gap: 2 — ABNORMAL LOW (ref 5–15)
BUN: 7 mg/dL (ref 6–20)
CO2: 24 mmol/L (ref 22–32)
Calcium: 7.8 mg/dL — ABNORMAL LOW (ref 8.9–10.3)
Chloride: 114 mmol/L — ABNORMAL HIGH (ref 101–111)
Creatinine, Ser: 0.59 mg/dL (ref 0.44–1.00)
GFR calc Af Amer: >60 mL/min (ref >60)
GFR calc non Af Amer: >60 mL/min (ref >60)
Glucose, Bld: 96 mg/dL (ref 65–99)
Potassium: 3.5 mmol/L (ref 3.5–5.1)
Sodium: 140 mmol/L (ref 135–145)

## 2016-01-28 NOTE — Progress Notes (Signed)
Pt  Has no complaints of nausea states she feels fine. She did say she felt her face and right hand felt swollen. No marked swelling noted. She is eating and drinking without difficulty. I stopped IV fluid. BP has improved as well.

## 2016-01-28 NOTE — Discharge Instructions (Signed)

## 2016-01-28 NOTE — Progress Notes (Signed)
Pt given discharge instructions all questions answered. She confirms she got her Hizentra back from the pharmacy.

## 2016-01-29 NOTE — Discharge Summary (Signed)
[facility] [place] at [facility]   PATIENT NAME: Erika Sanders    MR#:  [id number]  DATE OF BIRTH:  [birth year]  DATE OF ADMISSION:  01/26/2016 ADMITTING PHYSICIAN: Arnaldo Natal, MD  DATE OF DISCHARGE: 01/28/2016  4:24 PM  PRIMARY CARE PHYSICIAN: Binnie Kand, MD   ADMISSION DIAGNOSIS:  Orthostatic hypotension [I95.1] Dehydration [E86.0] Nausea and vomiting, vomiting of unspecified type [R11.2]  DISCHARGE DIAGNOSIS:  Active Problems:   Nausea and vomiting   SECONDARY DIAGNOSIS:   Past Medical History  Diagnosis Date  . Anxiety   . Depression   . Anemia   . Alternating constipation and diarrhea   . Immune deficiency disorder [facility])      ADMITTING HISTORY  Chief Complaint: Nausea and vomiting HPI: The patient with past medical history of severe gastritis presents emergency department complaining of nausea and vomiting. She states that she has been unable to keep food down for at least 2 days. She denies sick contacts or unusual food. She admits these symptoms have occurred before prior to diagnosis with primary immunodeficiency disorder. Following 4 L of fluid in the emergency department the patient was still unable to keep down food. She was also hypotensive with systolic blood pressure in the mid 80s which prompted the emergency department staff to call for admission.  HOSPITAL COURSE:   This is a 25 year old female admitted for intractable nausea and vomiting and hypotension. Acute viral gastroenteritis 1. Intractable nausea and vomiting: Symptomatic management Patient improved well and on the day of discharge she had no nausea or vomiting. Diarrhea has resolved. 2. Hypotension: Due to dehydration. improved with IV fluid. 3. Primary immune deficiency disorder: takes weekly immunoglobulin. Continue immunotherapy as outpatient. No isolation precautions necessary. 4. DVT prophylaxis: Lovenox 5. GI prophylaxis: Pantoprazole  On the  day of discharge patient is tolerating her food. No further vomiting or diarrhea. Blood pressure is in the normal range.  CONSULTS OBTAINED:     DRUG ALLERGIES:   Allergies  Allergen Reactions  . Lactase Nausea And Vomiting  . Other Other (See Comments)    Grass  . Gramineae Pollens Rash  . Lactose Nausea Only    DISCHARGE MEDICATIONS:   Discharge Medication List as of 01/28/2016 12:47 PM    START taking these medications   Details  ondansetron (ZOFRAN) 4 MG tablet Take 1 tablet (4 mg total) by mouth every 8 (eight) hours as needed for nausea or vomiting., Starting 01/26/2016, Until Discontinued, Print      CONTINUE these medications which have NOT CHANGED   Details  hydrOXYzine (ATARAX/VISTARIL) 25 MG tablet Take 25 mg by mouth every 8 (eight) hours as needed., Starting 08/18/2014, Until Discontinued, Historical Med    Immune Globulin, Human, (HIZENTRA [place]) Inject 7 g into the skin every 7 (seven) days., Until Discontinued, Historical Med    levonorgestrel (MIRENA) 20 MCG/24HR IUD 1 each by Intrauterine route once., Historical Med    omeprazole (PRILOSEC) 20 MG capsule Take 20 mg by mouth daily as needed (acid reflux/heartburn)., Until Discontinued, Historical Med        Today   VITAL SIGNS:  Blood pressure 108/63, pulse 75, temperature 98.5 F (36.9 C), temperature source Oral, resp. rate 18, height 5\' 1"  (1.549 m), weight 65.046 kg (143 lb 6.4 oz), last menstrual period 12/22/2015, SpO2 100 %.  I/O:  No intake or output data in the 24 hours ending 01/29/16 1529  PHYSICAL EXAMINATION:  Physical Exam  GENERAL:  25 y.o.-year-old patient lying  in the bed with no acute distress.  LUNGS: Normal breath sounds bilaterally, no wheezing, rales,rhonchi or crepitation. No use of accessory muscles of respiration.  CARDIOVASCULAR: S1, S2 normal. No murmurs, rubs, or gallops.  ABDOMEN: Soft, non-tender, non-distended. Bowel sounds present. No organomegaly or mass.  NEUROLOGIC:  Moves all 4 extremities. PSYCHIATRIC: The patient is alert and oriented x 3.  SKIN: No obvious rash, lesion, or ulcer.   DATA REVIEW:   CBC  Recent Labs Lab 01/28/16 0444  WBC 4.8  HGB 9.6*  HCT 28.8*  PLT 112*    Chemistries   Recent Labs Lab 01/26/16 1825 01/28/16 0444  NA 137 140  K 3.6 3.5  CL 105 114*  CO2 26 24  GLUCOSE 97 96  BUN 10 7  CREATININE 0.71 0.59  CALCIUM 8.2* 7.8*  AST 19  --   ALT 16  --   ALKPHOS 66  --   BILITOT 0.8  --     Cardiac Enzymes No results for input(s): TROPONINI in the last 168 hours.  Microbiology Results  Results for orders placed or performed during the hospital encounter of 01/02/16  Chlamydia/NGC rt PCR     Status: None   Collection Time: 01/02/16  1:32 AM  Result Value Ref Range Status   Specimen source GC/Chlam ENDOCERVICAL  Final   Chlamydia Tr NOT DETECTED NOT DETECTED Final   N gonorrhoeae NOT DETECTED NOT DETECTED Final    Comment: (NOTE) 100  This methodology has not been evaluated in pregnant women or in 200  patients with a history of hysterectomy. 300 400  This methodology will not be performed on patients less than 3714  years of age.   Wet prep, genital     Status: Abnormal   Collection Time: 01/02/16  1:32 AM  Result Value Ref Range Status   Yeast Wet Prep HPF POC NONE SEEN NONE SEEN Final   Trich, Wet Prep NONE SEEN NONE SEEN Final   Clue Cells Wet Prep HPF POC NONE SEEN NONE SEEN Final   WBC, Wet Prep HPF POC MODERATE (A) NONE SEEN Final   Sperm NONE SEEN  Final  Chlamydia/NGC rt PCR ([facility] only)     Status: None   Collection Time: 01/02/16  4:18 AM  Result Value Ref Range Status   Specimen source GC/Chlam URINE, RANDOM  Corrected    Comment: CORRECTED ON 04/26 AT [id number]: PREVIOUSLY REPORTED AS ENDOCERVICAL   Chlamydia Tr NOT DETECTED NOT DETECTED Final   N gonorrhoeae NOT DETECTED NOT DETECTED Final    Comment: (NOTE) 100  This methodology has not been evaluated in pregnant women or in 200   patients with a history of hysterectomy. 300 400  This methodology will not be performed on patients less than 4614  years of age.     RADIOLOGY:  No results found.  Follow up with PCP in 1 week.  Management plans discussed with the patient, family and they are in agreement.  CODE STATUS:  Code Status History    Date Active Date Inactive Code Status Order ID Comments User Context   01/27/2016  5:50 AM 01/28/2016  7:25 PM Full Code [id number]  Arnaldo NatalMichael S Diamond, MD ED      TOTAL TIME TAKING CARE OF THIS PATIENT ON DAY OF DISCHARGE: more than 30 minutes.   Milagros LollSudini, Rondo Spittler R M.D on 01/29/2016 at 3:29 PM  Between 7am to 6pm - Pager - [phone]  After 6pm go to [email or web address] - password [email or web address]  [facility]  Office  [phone]  CC: Primary care physician; Binnie Kand, MD  Note: This dictation was prepared with Dragon dictation along with smaller phrase technology. Any transcriptional errors that result from this process are unintentional.

## 2016-09-12 NOTE — H&P (Signed)
Patient ID: Erika Sanders a 26 y.o. female presenting with Surgery Consult (for Chronic Pelvic Pain)  on 09/11/2016  HPI: Erika Sanders Sanders a [id number] who has a long history of chronic pelvic pain, and who has been managed by other providers and has failed outpatient/hormonal management who has been counseled extensively about options and risks.  She has bee advised that combination oral contraceptives are contraindicated due to migraines, although without aura they are not.  She does not wish to trial them again. She recently removed her progestin IUD and continues to have debilitating pain and heavy bleeding.  She has made up her mind that she has completed childbearing and would like a hysterectomy.  She Sanders uninterested in any other options. She Sanders at today's visit with her mother and daughter.  Pap 06/2016 Sanders negative, GC/CT negative.  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  Past Surgical History:  has a past surgical history that includes Tonsillectomy and adenoidectomy (2010); extraction teeth; Colonoscopy (05/07/2015); egd (05/07/2015); and Appendectomy (2015). Family History: family history includes Colon polyps in her maternal grandmother; High blood pressure (Hypertension) in her father and mother; Hyperlipidemia (Elevated cholesterol) in her father and mother; Melanoma in her father; Multiple sclerosis in her paternal grandmother. Social History:  reports that she has been smoking.  She has a 1.00 pack-year smoking history. She has never used smokeless tobacco. She reports that she drinks alcohol. She reports that she does not use drugs. OB/GYN History:          OB History    Gravida Para Term Preterm AB Living   2 2 2     2    SAB TAB Ectopic Molar Multiple Live Births              2      Allergies: Sanders allergic to dairy aid [lactase]; grass pollen; and other. Medications: No current outpatient prescriptions on file.   Review of Systems: No SOB, no palpitations or chest pain, no new lower extremity edema, no nausea or vomiting or bowel or bladder complaints. See HPI for gyn specific ROS.   Exam:   BP 112/68   Pulse 93   Ht 154.9 cm (5\' 1" )   Wt 55.6 kg (122 lb 9.6 oz)   LMP 08/19/2016 (Exact Date)   BMI 23.17 kg/m   General: Patient Sanders well-groomed, well-nourished, appears stated age in no acute distress  HEENT: head Sanders atraumatic and normocephalic, trachea Sanders midline, neck Sanders supple with no palpable nodules  CV: Regular rhythm and normal heart rate, no murmur  Pulm: Clear to auscultation throughout lung fields with no wheezing, crackles, or rhonchi. No increased work of breathing  Abdomen: soft , no mass, non-tender, no rebound tenderness, no hepatomegaly  Pelvic: tanner stage 5 ,              External genitalia: vulva /labia no lesions             Urethra: no prolapse             Vagina: normal physiologic d/c, laxity in vaginal walls             Cervix: no lesions, no cervical motion tenderness, good descent             Uterus: normal size shape and contour, non-tender  Adnexa: no mass,  non-tender               Rectovaginal: External wnl   Impression:   The primary encounter diagnosis was Chronic female pelvic pain. Diagnoses of Dysmenorrhea and Menorrhagia with regular cycle were also pertinent to this visit.    Plan:   I had a frank and open discussion with the patient about her age and wish for hysterectomy.  She understands that this will completely inhibit her from childbearing, and to have another genetically hers child she would have to have a gestational carrier.  She was given the scenario of: her entire family dies in a tragic accident, and she years later meets someone and wants to have a family  with them.  She without hesitation stated she Sanders ok with that.  She will adopt if necessary.   We also spoke frankly about this surgery may not meet her expectations of resolving her chronic pain.  That she could undergo surgery and still feel the exact same sexual, pelvic, and non-menstrual pain, AND that if she has endometriosis implants elsewhere in the pelvis, with her ovaries remaining intact, that she would potentially still have cyclical pain due to these un-identified lesions.  After all of this, she still wishes to proceed.  Thus, citing patient autonomy, I have agreed to perform a total laparoscopic hysterectomy and bilateral salpingectomy. The patient and I discussed the technical aspects of the procedure including the potential for risks and complications.These include but are not limited to the risk of infection requiring post-operative antibiotics or further procedures.We talked about the risk of injury to adjacent organs including bladder, bowel, ureter, blood vessels or nerves.We talked about the need to convert to an open incision.We talked about the possibleneed for blood transfusion.We talked aboutpostop complications such asthromboembolic or cardiopulmonary complications.All of her questions were answered. Her preoperative exam was completed andthe appropriate consents were signed. She Sanders scheduled to undergo this procedure in the near future.   I personally performed the service.  (TP)  CHELSEA CRIST WARD, MD

## 2016-09-17 ENCOUNTER — Encounter
Admission: RE | Admit: 2016-09-17 | Discharge: 2016-09-17 | Disposition: A | Discharge status: Home / Self Care | Payer: Medicaid Other | Source: Ambulatory Visit | Attending: Obstetrics & Gynecology | Admitting: Obstetrics & Gynecology

## 2016-09-17 DIAGNOSIS — R102 Pelvic and perineal pain: Secondary | ICD-10-CM | POA: Diagnosis not present

## 2016-09-17 DIAGNOSIS — G8929 Other chronic pain: Secondary | ICD-10-CM | POA: Diagnosis not present

## 2016-09-17 DIAGNOSIS — Z01812 Encounter for preprocedural laboratory examination: Secondary | ICD-10-CM | POA: Insufficient documentation

## 2016-09-17 DIAGNOSIS — Z0183 Encounter for blood typing: Secondary | ICD-10-CM | POA: Diagnosis not present

## 2016-09-17 HISTORY — DX: Irritable bowel syndrome, unspecified: K58.9

## 2016-09-17 LAB — BASIC METABOLIC PANEL
Anion gap: 7 (ref 5–15)
BUN: <5 mg/dL — ABNORMAL LOW (ref 6–20)
CO2: 26 mmol/L (ref 22–32)
Calcium: 9 mg/dL (ref 8.9–10.3)
Chloride: 106 mmol/L (ref 101–111)
Creatinine, Ser: 0.57 mg/dL (ref 0.44–1.00)
GFR calc Af Amer: >60 mL/min (ref >60)
GFR calc non Af Amer: >60 mL/min (ref >60)
Glucose, Bld: 76 mg/dL (ref 65–99)
Potassium: 3.6 mmol/L (ref 3.5–5.1)
Sodium: 139 mmol/L (ref 135–145)

## 2016-09-17 LAB — CBC
HCT: 36.5 % (ref 35.0–47.0)
Hemoglobin: 12.2 g/dL (ref 12.0–16.0)
MCH: 27.4 pg (ref 26.0–34.0)
MCHC: 33.4 g/dL (ref 32.0–36.0)
MCV: 82.1 fL (ref 80.0–100.0)
Platelets: 172 10*3/uL (ref 150–440)
RBC: 4.45 MIL/uL (ref 3.80–5.20)
RDW: 13.9 % (ref 11.5–14.5)
WBC: 7.6 10*3/uL (ref 3.6–11.0)

## 2016-09-17 LAB — TYPE AND SCREEN
ABO/RH(D): A POS
Antibody Screen: NEGATIVE

## 2016-09-17 NOTE — Patient Instructions (Signed)
  Your procedure is scheduled on:1`/19/18 Report to Day Surgery.medical [facility] second floor To find out your arrival time please call [phone] between 1PM - 3PM on 09/25/16  Remember: Instructions that are not followed completely may result in serious medical risk, up to and including death, or upon the discretion of your surgeon and anesthesiologist your surgery may need to be rescheduled.    _x___ 1. Do not eat food or drink liquids after midnight. No gum chewing or hard candies.     __x__ 2. No Alcohol for 24 hours before or after surgery.   _x___ 3. Do Not Smoke For 24 Hours Prior to Your Surgery.   ____ 4. Bring all medications with you on the day of surgery if instructed.    _x___ 5. Notify your doctor if there is any change in your medical condition     (cold, fever, infections).       Do not wear jewelry, make-up, hairpins, clips or nail polish.  Do not wear lotions, powders, or perfumes. You may wear deodorant.  Do not shave 48 hours prior to surgery. Men may shave face and neck.  Do not bring valuables to the hospital.    [facility] is not responsible for any belongings or valuables.               Contacts, dentures or bridgework may not be worn into surgery.  Leave your suitcase in the car. After surgery it may be brought to your room.  For patients admitted to the hospital, discharge time is determined by your                treatment team.   Patients discharged the day of surgery will not be allowed to drive home.    ____ Take these medicines the morning of surgery with A SIP OF WATER:    1. None  2.   3.   4.  5.  6.  ____ Fleet Enema (as directed)   __x__ Use CHG Soap as directed  ____ Use inhalers on the day of surgery  ____ Stop metformin 2 days prior to surgery    ____ Take 1/2 of usual insulin dose the night before surgery and none on the morning of surgery.   ____ Stop Coumadin/Plavix/aspirin on   ____ Stop Anti-inflammatories on    ____  Stop supplements until after surgery.    ____ Bring C-Pap to the hospital.

## 2016-09-26 ENCOUNTER — Encounter
Admission: RE | Disposition: A | Discharge status: Home / Self Care | Payer: Self-pay | Source: Ambulatory Visit | Attending: Obstetrics & Gynecology

## 2016-09-26 ENCOUNTER — Ambulatory Visit
Admission: RE | Admit: 2016-09-26 | Discharge: 2016-09-26 | Disposition: A | Discharge status: Home / Self Care | Payer: Medicaid Other | Source: Ambulatory Visit | Attending: Obstetrics & Gynecology | Admitting: Obstetrics & Gynecology

## 2016-09-26 ENCOUNTER — Encounter: Payer: Self-pay | Admitting: *Deleted

## 2016-09-26 ENCOUNTER — Ambulatory Visit: Payer: Medicaid Other | Admitting: Certified Registered"

## 2016-09-26 DIAGNOSIS — D649 Anemia, unspecified: Secondary | ICD-10-CM | POA: Diagnosis not present

## 2016-09-26 DIAGNOSIS — F419 Anxiety disorder, unspecified: Secondary | ICD-10-CM | POA: Insufficient documentation

## 2016-09-26 DIAGNOSIS — R102 Pelvic and perineal pain: Secondary | ICD-10-CM | POA: Insufficient documentation

## 2016-09-26 DIAGNOSIS — Z91048 Other nonmedicinal substance allergy status: Secondary | ICD-10-CM | POA: Diagnosis not present

## 2016-09-26 DIAGNOSIS — K219 Gastro-esophageal reflux disease without esophagitis: Secondary | ICD-10-CM | POA: Insufficient documentation

## 2016-09-26 DIAGNOSIS — F329 Major depressive disorder, single episode, unspecified: Secondary | ICD-10-CM | POA: Insufficient documentation

## 2016-09-26 DIAGNOSIS — G8929 Other chronic pain: Secondary | ICD-10-CM | POA: Diagnosis present

## 2016-09-26 DIAGNOSIS — N946 Dysmenorrhea, unspecified: Secondary | ICD-10-CM | POA: Diagnosis not present

## 2016-09-26 DIAGNOSIS — N72 Inflammatory disease of cervix uteri: Secondary | ICD-10-CM | POA: Diagnosis not present

## 2016-09-26 DIAGNOSIS — F17210 Nicotine dependence, cigarettes, uncomplicated: Secondary | ICD-10-CM | POA: Diagnosis not present

## 2016-09-26 DIAGNOSIS — K589 Irritable bowel syndrome without diarrhea: Secondary | ICD-10-CM | POA: Insufficient documentation

## 2016-09-26 DIAGNOSIS — N920 Excessive and frequent menstruation with regular cycle: Secondary | ICD-10-CM | POA: Insufficient documentation

## 2016-09-26 DIAGNOSIS — N838 Other noninflammatory disorders of ovary, fallopian tube and broad ligament: Secondary | ICD-10-CM | POA: Diagnosis not present

## 2016-09-26 DIAGNOSIS — Z91011 Allergy to milk products: Secondary | ICD-10-CM | POA: Insufficient documentation

## 2016-09-26 HISTORY — PX: LAPAROSCOPIC HYSTERECTOMY: SHX1926

## 2016-09-26 HISTORY — PX: LAPAROSCOPIC BILATERAL SALPINGECTOMY: SHX5889

## 2016-09-26 LAB — POCT PREGNANCY, URINE: Preg Test, Ur: NEGATIVE

## 2016-09-26 LAB — ABO/RH: ABO/RH(D): A POS

## 2016-09-26 SURGERY — HYSTERECTOMY, TOTAL, LAPAROSCOPIC
Anesthesia: General

## 2016-09-26 MED ORDER — EPHEDRINE SULFATE 50 MG/ML IJ SOLN
INTRAMUSCULAR | Status: DC | PRN
  Administered 2016-09-26: 10 mg via INTRAVENOUS

## 2016-09-26 MED ORDER — GABAPENTIN 300 MG PO CAPS
300.0000 mg | ORAL_CAPSULE | Freq: Once | ORAL | Status: AC
  Administered 2016-09-26: 300 mg via ORAL

## 2016-09-26 MED ORDER — FAMOTIDINE 20 MG PO TABS
ORAL_TABLET | ORAL | Status: AC
  Administered 2016-09-26: 20 mg via ORAL
  Filled 2016-09-26: qty 1

## 2016-09-26 MED ORDER — HEPARIN SODIUM (PORCINE) 5000 UNIT/ML IJ SOLN
INTRAMUSCULAR | Status: AC
  Administered 2016-09-26: 5000 [IU] via SUBCUTANEOUS
  Filled 2016-09-26: qty 1

## 2016-09-26 MED ORDER — FENTANYL CITRATE (PF) 100 MCG/2ML IJ SOLN: 25.0000 ug | INTRAMUSCULAR | Status: DC | PRN

## 2016-09-26 MED ORDER — PHENYLEPHRINE 40 MCG/ML (10ML) SYRINGE FOR IV PUSH (FOR BLOOD PRESSURE SUPPORT)
PREFILLED_SYRINGE | INTRAVENOUS | Status: AC
  Filled 2016-09-26: qty 10

## 2016-09-26 MED ORDER — EPHEDRINE 5 MG/ML INJ
INTRAVENOUS | Status: AC
  Filled 2016-09-26: qty 10

## 2016-09-26 MED ORDER — OXYCODONE HCL 5 MG PO TABS
5.0000 mg | ORAL_TABLET | ORAL | Status: DC | PRN
  Administered 2016-09-26: 5 mg via ORAL

## 2016-09-26 MED ORDER — HEPARIN SODIUM (PORCINE) 5000 UNIT/ML IJ SOLN
5000.0000 [IU] | INTRAMUSCULAR | Status: AC
  Administered 2016-09-26: 5000 [IU] via SUBCUTANEOUS

## 2016-09-26 MED ORDER — FENTANYL CITRATE (PF) 100 MCG/2ML IJ SOLN
INTRAMUSCULAR | Status: DC | PRN
  Administered 2016-09-26 (×5): 50 ug via INTRAVENOUS

## 2016-09-26 MED ORDER — SUGAMMADEX SODIUM 200 MG/2ML IV SOLN
INTRAVENOUS | Status: AC
  Filled 2016-09-26: qty 2

## 2016-09-26 MED ORDER — OXYCODONE HCL 5 MG PO TABS
ORAL_TABLET | ORAL | Status: DC
Start: 2016-09-26 — End: 2016-09-26
  Filled 2016-09-26: qty 1

## 2016-09-26 MED ORDER — DEXAMETHASONE SODIUM PHOSPHATE 10 MG/ML IJ SOLN
INTRAMUSCULAR | Status: AC
  Filled 2016-09-26: qty 1

## 2016-09-26 MED ORDER — BUPIVACAINE HCL (PF) 0.5 % IJ SOLN
INTRAMUSCULAR | Status: AC
  Filled 2016-09-26: qty 30

## 2016-09-26 MED ORDER — LIDOCAINE HCL (CARDIAC) 20 MG/ML IV SOLN
INTRAVENOUS | Status: DC | PRN
  Administered 2016-09-26: 50 mg via INTRAVENOUS

## 2016-09-26 MED ORDER — ACETAMINOPHEN 500 MG PO TABS: 500.0000 mg | ORAL_TABLET | Freq: Four times a day (QID) | ORAL | 0 refills | Status: AC | PRN

## 2016-09-26 MED ORDER — FAMOTIDINE 20 MG PO TABS
20.0000 mg | ORAL_TABLET | Freq: Once | ORAL | Status: AC
  Administered 2016-09-26: 20 mg via ORAL

## 2016-09-26 MED ORDER — ONDANSETRON HCL 4 MG/2ML IJ SOLN: 4.0000 mg | Freq: Once | INTRAMUSCULAR | Status: DC | PRN

## 2016-09-26 MED ORDER — PROPOFOL 10 MG/ML IV BOLUS
INTRAVENOUS | Status: AC
  Filled 2016-09-26: qty 20

## 2016-09-26 MED ORDER — DEXAMETHASONE SODIUM PHOSPHATE 10 MG/ML IJ SOLN
INTRAMUSCULAR | Status: DC | PRN
  Administered 2016-09-26: 10 mg via INTRAVENOUS

## 2016-09-26 MED ORDER — CELECOXIB 200 MG PO CAPS
200.0000 mg | ORAL_CAPSULE | Freq: Once | ORAL | Status: AC
  Administered 2016-09-26: 200 mg via ORAL

## 2016-09-26 MED ORDER — KETOROLAC TROMETHAMINE 30 MG/ML IJ SOLN
INTRAMUSCULAR | Status: DC | PRN
  Administered 2016-09-26: 30 mg via INTRAVENOUS

## 2016-09-26 MED ORDER — ONDANSETRON HCL 4 MG/2ML IJ SOLN
INTRAMUSCULAR | Status: AC
  Filled 2016-09-26: qty 2

## 2016-09-26 MED ORDER — ONDANSETRON HCL 4 MG/2ML IJ SOLN
INTRAMUSCULAR | Status: DC | PRN
  Administered 2016-09-26: 4 mg via INTRAVENOUS

## 2016-09-26 MED ORDER — FENTANYL CITRATE (PF) 100 MCG/2ML IJ SOLN
25.0000 ug | INTRAMUSCULAR | Status: DC | PRN
Start: 2016-09-26 — End: 2016-09-26
  Administered 2016-09-26 (×3): 25 ug via INTRAVENOUS

## 2016-09-26 MED ORDER — LACTATED RINGERS IV SOLN
INTRAVENOUS | Status: DC
  Administered 2016-09-26: 75 mL/h via INTRAVENOUS

## 2016-09-26 MED ORDER — MIDAZOLAM HCL 2 MG/2ML IJ SOLN
INTRAMUSCULAR | Status: AC
Start: 2016-09-26 — End: 2016-09-26
  Filled 2016-09-26: qty 2

## 2016-09-26 MED ORDER — FENTANYL CITRATE (PF) 250 MCG/5ML IJ SOLN
INTRAMUSCULAR | Status: AC
  Filled 2016-09-26: qty 5

## 2016-09-26 MED ORDER — ROCURONIUM BROMIDE 50 MG/5ML IV SOSY
PREFILLED_SYRINGE | INTRAVENOUS | Status: AC
  Filled 2016-09-26: qty 5

## 2016-09-26 MED ORDER — ACETAMINOPHEN 10 MG/ML IV SOLN
INTRAVENOUS | Status: DC | PRN
  Administered 2016-09-26: 1000 mg via INTRAVENOUS

## 2016-09-26 MED ORDER — OXYCODONE HCL 5 MG PO TABS: 5.0000 mg | ORAL_TABLET | ORAL | 0 refills | Status: AC | PRN

## 2016-09-26 MED ORDER — GABAPENTIN 300 MG PO CAPS
ORAL_CAPSULE | ORAL | Status: AC
  Administered 2016-09-26: 300 mg via ORAL
  Filled 2016-09-26: qty 1

## 2016-09-26 MED ORDER — PROPOFOL 10 MG/ML IV BOLUS
INTRAVENOUS | Status: DC | PRN
  Administered 2016-09-26: 180 mg via INTRAVENOUS

## 2016-09-26 MED ORDER — SUGAMMADEX SODIUM 200 MG/2ML IV SOLN
INTRAVENOUS | Status: DC | PRN
  Administered 2016-09-26: 120 mg via INTRAVENOUS

## 2016-09-26 MED ORDER — CEFAZOLIN SODIUM-DEXTROSE 2-4 GM/100ML-% IV SOLN
2.0000 g | INTRAVENOUS | Status: AC
  Administered 2016-09-26: 2 g via INTRAVENOUS

## 2016-09-26 MED ORDER — ACETAMINOPHEN 10 MG/ML IV SOLN
INTRAVENOUS | Status: AC
  Filled 2016-09-26: qty 100

## 2016-09-26 MED ORDER — PHENYLEPHRINE HCL 10 MG/ML IJ SOLN
INTRAMUSCULAR | Status: DC | PRN
  Administered 2016-09-26: 80 ug via INTRAVENOUS
  Administered 2016-09-26: 120 ug via INTRAVENOUS
  Administered 2016-09-26 (×2): 80 ug via INTRAVENOUS
  Administered 2016-09-26: 40 ug via INTRAVENOUS

## 2016-09-26 MED ORDER — CEFAZOLIN SODIUM-DEXTROSE 2-4 GM/100ML-% IV SOLN
INTRAVENOUS | Status: AC
Start: 2016-09-26 — End: 2016-09-26
  Administered 2016-09-26: 2 g via INTRAVENOUS
  Filled 2016-09-26: qty 100

## 2016-09-26 MED ORDER — MIDAZOLAM HCL 2 MG/2ML IJ SOLN
INTRAMUSCULAR | Status: DC | PRN
  Administered 2016-09-26: 2 mg via INTRAVENOUS

## 2016-09-26 MED ORDER — KETOROLAC TROMETHAMINE 30 MG/ML IJ SOLN
INTRAMUSCULAR | Status: AC
  Filled 2016-09-26: qty 1

## 2016-09-26 MED ORDER — FENTANYL CITRATE (PF) 100 MCG/2ML IJ SOLN
INTRAMUSCULAR | Status: AC
Start: 2016-09-26 — End: 2016-09-26
  Administered 2016-09-26: 25 ug via INTRAVENOUS
  Filled 2016-09-26: qty 2

## 2016-09-26 MED ORDER — IBUPROFEN 600 MG PO TABS: 600.0000 mg | ORAL_TABLET | Freq: Four times a day (QID) | ORAL | 0 refills | Status: AC | PRN

## 2016-09-26 MED ORDER — ROCURONIUM BROMIDE 100 MG/10ML IV SOLN
INTRAVENOUS | Status: DC | PRN
  Administered 2016-09-26: 40 mg via INTRAVENOUS

## 2016-09-26 MED ORDER — CELECOXIB 200 MG PO CAPS
ORAL_CAPSULE | ORAL | Status: AC
  Administered 2016-09-26: 200 mg via ORAL
  Filled 2016-09-26: qty 1

## 2016-09-26 SURGICAL SUPPLY — 53 items
BACTOSHIELD CHG 4% 4OZ (MISCELLANEOUS) ×1
BAG URO DRAIN 2000ML W/SPOUT (MISCELLANEOUS) ×3 IMPLANT
BLADE SURG SZ11 CARB STEEL (BLADE) ×3 IMPLANT
CANISTER SUCT 1200ML W/VALVE (MISCELLANEOUS) ×3 IMPLANT
CATH FOLEY 2WAY  5CC 16FR (CATHETERS) ×1
CATH URTH 16FR FL 2W BLN LF (CATHETERS) ×2 IMPLANT
CHLORAPREP W/TINT 26ML (MISCELLANEOUS) ×6 IMPLANT
DEFOGGER SCOPE WARMER CLEARIFY (MISCELLANEOUS) ×3 IMPLANT
DRAPE LEGGINS SURG 28X43 STRL (DRAPES) ×3 IMPLANT
DRAPE SHEET LG 3/4 BI-LAMINATE (DRAPES) ×3 IMPLANT
DRAPE UNDER BUTTOCK W/FLU (DRAPES) ×3 IMPLANT
GLOVE PI ORTHOPRO 6.5 (GLOVE) ×1
GLOVE PI ORTHOPRO STRL 6.5 (GLOVE) ×2 IMPLANT
GLOVE SURG SYN 6.5 ES PF (GLOVE) ×9 IMPLANT
GOWN STRL REUS W/ TWL LRG LVL3 (GOWN DISPOSABLE) ×6 IMPLANT
GOWN STRL REUS W/ TWL XL LVL3 (GOWN DISPOSABLE) ×2 IMPLANT
GOWN STRL REUS W/TWL LRG LVL3 (GOWN DISPOSABLE) ×3
GOWN STRL REUS W/TWL XL LVL3 (GOWN DISPOSABLE) ×1
IRRIGATION STRYKERFLOW (MISCELLANEOUS) ×2 IMPLANT
IRRIGATOR STRYKERFLOW (MISCELLANEOUS) ×3
IV LACTATED RINGERS 1000ML (IV SOLUTION) ×3 IMPLANT
KIT PINK PAD W/HEAD ARE REST (MISCELLANEOUS) ×3
KIT PINK PAD W/HEAD ARM REST (MISCELLANEOUS) ×2 IMPLANT
KIT RM TURNOVER CYSTO AR (KITS) ×3 IMPLANT
L-HOOK LAP DISP 36CM (ELECTROSURGICAL) ×3
LABEL OR SOLS (LABEL) IMPLANT
LHOOK LAP DISP 36CM (ELECTROSURGICAL) ×2 IMPLANT
LIGASURE BLUNT 5MM 37CM (INSTRUMENTS) ×3 IMPLANT
LIQUID BAND (GAUZE/BANDAGES/DRESSINGS) ×3 IMPLANT
MANIPULATOR VCARE LG CRV RETR (MISCELLANEOUS) IMPLANT
MANIPULATOR VCARE SML CRV RETR (MISCELLANEOUS) IMPLANT
MANIPULATOR VCARE STD CRV RETR (MISCELLANEOUS) ×3 IMPLANT
NS IRRIG 500ML POUR BTL (IV SOLUTION) ×3 IMPLANT
PACK LAP CHOLECYSTECTOMY (MISCELLANEOUS) ×3 IMPLANT
PAD OB MATERNITY 4.3X12.25 (PERSONAL CARE ITEMS) ×3 IMPLANT
PAD PREP 24X41 OB/GYN DISP (PERSONAL CARE ITEMS) ×3 IMPLANT
PENCIL ELECTRO HAND CTR (MISCELLANEOUS) ×3 IMPLANT
SCRUB CHG 4% DYNA-HEX 4OZ (MISCELLANEOUS) ×2 IMPLANT
SET CYSTO W/LG BORE CLAMP LF (SET/KITS/TRAYS/PACK) IMPLANT
SLEEVE ENDOPATH XCEL 5M (ENDOMECHANICALS) ×6 IMPLANT
SPONGE XRAY 4X4 16PLY STRL (MISCELLANEOUS) ×3 IMPLANT
SURGILUBE 2OZ TUBE FLIPTOP (MISCELLANEOUS) ×3 IMPLANT
SUT MNCRL 4-0 (SUTURE) ×1
SUT MNCRL 4-0 27XMFL (SUTURE) ×2
SUT MNCRL AB 4-0 PS2 18 (SUTURE) ×3 IMPLANT
SUT VIC AB 0 CT1 36 (SUTURE) ×6 IMPLANT
SUT VIC AB 4-0 PS2 18 (SUTURE) ×3 IMPLANT
SUTURE MNCRL 4-0 27XMF (SUTURE) ×2 IMPLANT
SYRINGE 10CC LL (SYRINGE) ×3 IMPLANT
TROCAR BLUNT TIP 12MM OMST12BT (TROCAR) IMPLANT
TROCAR XCEL NON-BLD 5MMX100MML (ENDOMECHANICALS) ×3 IMPLANT
TUBING INSUFFLATOR HEATED (MISCELLANEOUS) ×3 IMPLANT
TUBING INSUFFLATOR HI FLOW (MISCELLANEOUS) ×3 IMPLANT

## 2016-09-26 NOTE — Anesthesia Postprocedure Evaluation (Signed)
Anesthesia Post Note  Patient: Erika Sanders  Procedure(s) Performed: Procedure(s) (LRB): HYSTERECTOMY TOTAL LAPAROSCOPIC (N/A) LAPAROSCOPIC BILATERAL SALPINGECTOMY (Bilateral)  Patient location during evaluation: PACU Anesthesia Type: General Level of consciousness: awake and alert and oriented Pain management: pain level controlled Vital Signs Assessment: post-procedure vital signs reviewed and stable Respiratory status: spontaneous breathing Cardiovascular status: blood pressure returned to baseline Anesthetic complications: no     Last Vitals:  Vitals:   09/26/16 1302 09/26/16 1353  BP: 97/61 101/62  Pulse: 84 87  Resp: 16 18  Temp: 36.4 C     Last Pain:  Vitals:   09/26/16 1426  TempSrc:   PainSc: 5                  Velvia Mehrer

## 2016-09-26 NOTE — Interval H&P Note (Signed)
History and Physical Interval Note:  09/26/2016 10:11 AM  Erika DauerAlexandra B Cockrum  has presented today for surgery, with the diagnosis of Chronic Pelvic Pain  The various methods of treatment have been discussed with the patient and family. After consideration of risks, benefits and other options for treatment, the patient has consented to  Procedure(s): HYSTERECTOMY TOTAL LAPAROSCOPIC (N/A) LAPAROSCOPIC BILATERAL SALPINGECTOMY (Bilateral) as a surgical intervention .  The patient's history has been reviewed, patient examined, no change in status, stable for surgery.  I have reviewed the patient's chart and labs.  Questions were answered to the patient's satisfaction.     Beya Tipps C Janel Beane

## 2016-09-26 NOTE — Discharge Instructions (Signed)
Discharge instructions:  °Call office if you have any of the following: fever >101 F, chills, excessive vaginal bleeding, incision drainage or problems, leg pain or redness, or any other concerns.  ° °Activity: Do not lift > 10 lbs for 8 weeks.  °No intercourse or tampons for 8 weeks.  °No driving for 1-2 weeks.  ° °You may feel some pain in your upper right abdomen/rib and right shoulder.  This is from the gas in the abdomen for surgery. This will subside over time, please be patient! ° °Take 600mg Ibuprofen and 1000mg Tylenol around the clock, every 6 hours for at least the first 3-5 days.  After this you can take as needed.  This will help decrease inflammation and promote healing.  The narcotics you'll take just as needed, as they just trick your brain into thinking its not in pain.   ° °Please don't limit yourself in terms of routine activity.  You will be able to do most things, although they may take longer to do or be a little painful.  You can do it! ° °Don't be a hero, but don't be a wimp either!  ° ° °AMBULATORY SURGERY  °DISCHARGE INSTRUCTIONS ° ° °1) The drugs that you were given will stay in your system until tomorrow so for the next 24 hours you should not: ° °A) Drive an automobile °B) Make any legal decisions °C) Drink any alcoholic beverage ° ° °2) You may resume regular meals tomorrow.  Today it is better to start with liquids and gradually work up to solid foods. ° °You may eat anything you prefer, but it is better to start with liquids, then soup and crackers, and gradually work up to solid foods. ° ° °3) Please notify your doctor immediately if you have any unusual bleeding, trouble breathing, redness and pain at the surgery site, drainage, fever, or pain not relieved by medication. ° ° ° °4) Additional Instructions: ° ° ° ° ° ° ° °Please contact your physician with any problems or Same Day Surgery at [phone], Monday through Friday 6 am to 4 pm, or [place] [facility] [place] Main number at  [phone]. ° °

## 2016-09-26 NOTE — Anesthesia Post-op Follow-up Note (Cosign Needed)
Anesthesia QCDR form completed.        

## 2016-09-26 NOTE — Anesthesia Preprocedure Evaluation (Signed)
Anesthesia Evaluation  Patient identified by MRN, date of birth, ID band Patient awake    Reviewed: Allergy & Precautions, H&P , NPO status , Patient's Chart, lab work & pertinent test results, reviewed documented beta blocker date and time   History of Anesthesia Complications Negative for: history of anesthetic complications  Airway Mallampati: II  TM Distance: >3 FB Neck ROM: full    Dental no notable dental hx. (+) Teeth Intact   Pulmonary neg shortness of breath, neg sleep apnea, neg COPD, Recent URI , Current Smoker,    Pulmonary exam normal breath sounds clear to auscultation       Cardiovascular Exercise Tolerance: Good negative cardio ROS Normal cardiovascular exam Rhythm:regular Rate:Normal     Neuro/Psych  Headaches, neg Seizures PSYCHIATRIC DISORDERS Anxiety Depression    GI/Hepatic Neg liver ROS, GERD  Medicated and Controlled,IBS   Endo/Other  negative endocrine ROS  Renal/GU negative Renal ROS  negative genitourinary   Musculoskeletal negative musculoskeletal ROS (+)   Abdominal   Peds negative pediatric ROS (+)  Hematology  (+) Blood dyscrasia, anemia ,   Anesthesia Other Findings Past Medical History:   Anxiety                                                      Depression                                                   Anemia                                                       Alternating constipation and diarrhea                        Reproductive/Obstetrics negative OB ROS                             Anesthesia Physical  Anesthesia Plan  ASA: II  Anesthesia Plan: General   Post-op Pain Management:    Induction: Intravenous  Airway Management Planned: Oral ETT  Additional Equipment:   Intra-op Plan:   Post-operative Plan: Extubation in OR  Informed Consent: I have reviewed the patients History and Physical, chart, labs and discussed the  procedure including the risks, benefits and alternatives for the proposed anesthesia with the patient or authorized representative who has indicated his/her understanding and acceptance.   Dental Advisory Given  Plan Discussed with: Anesthesiologist, CRNA and Surgeon  Anesthesia Plan Comments:         Anesthesia Quick Evaluation

## 2016-09-26 NOTE — Transfer of Care (Signed)
Immediate Anesthesia Transfer of Care Note  Patient: Erika Sanders  Procedure(s) Performed: Procedure(s): HYSTERECTOMY TOTAL LAPAROSCOPIC (N/A) LAPAROSCOPIC BILATERAL SALPINGECTOMY (Bilateral)  Patient Location: PACU  Anesthesia Type:General  Level of Consciousness: awake  Airway & Oxygen Therapy: Patient Spontanous Breathing and Patient connected to face mask oxygen  Post-op Assessment: Report given to RN and Post -op Vital signs reviewed and stable  Post vital signs: Reviewed  Last Vitals:  Vitals:   09/26/16 0949 09/26/16 1207  BP: 106/65 (!) 92/55  Pulse: 78 100  Resp: 16 12  Temp: 37 C     Last Pain:  Vitals:   09/26/16 0949  TempSrc: Oral      Patients Stated Pain Goal: 0 (09/26/16 0949)  Complications: No apparent anesthesia complications

## 2016-09-26 NOTE — Op Note (Addendum)
Total Laparoscopic Hysterectomy Operative Note Procedure Date: 09/26/2016  Patient:  Erika Sanders Forbush  26 y.o. female  PRE-OPERATIVE DIAGNOSIS:  Chronic Pelvic Pain  POST-OPERATIVE DIAGNOSIS:  Chronic Pelvic Pain  PROCEDURE:  Procedure(s): HYSTERECTOMY TOTAL LAPAROSCOPIC (N/A) LAPAROSCOPIC BILATERAL SALPINGECTOMY (Bilateral)  SURGEON:  Surgeon(s) and Role:    * Camelia Stelzner C Serenity Batley, MD - Primary Laneta SimmersJamie Benson, RN First Assist  ANESTHESIA:  General via ET  I/O  Total I/O In: 800 [I.V.:800] Out: 200 [Urine:100; Blood:100]  FINDINGS:  Small uterus, normal ovaries and fallopian tubes bilaterally.  Normal upper abdomen. Dilated cecum.  SPECIMEN: Uterus, Cervix, and bilateral fallopian tubes  COMPLICATIONS: none apparent  DISPOSITION: vital signs stable to PACU  Indication for Surgery: 26 y.o. G2P2 with chronic dysmenorrhea and pelvic pain requesting hysterectomy for potential pain relief.  She was given many options and time for deliberation, and decided upon hysterectomy for definitive treatment.  She understands this operation may cause regret and may not satisfactorily reduce her pain.  Risks of surgery were discussed with the patient including but not limited to: bleeding which may require transfusion or reoperation; infection which may require antibiotics; injury to bowel, bladder, ureters or other surrounding organs; need for additional procedures including laparotomy, blood clot, incisional problems and other postoperative/anesthesia complications. Written informed consent was obtained.      PROCEDURE IN DETAIL:  The patient had 5000u Heparin Sub-q and sequential compression devices applied to her lower extremities while in the preoperative area.  She was then taken to the operating room. IV antibiotics were given. General anesthesia was administered via endotracheal route.  She was placed in the dorsal lithotomy position, and was prepped and draped in a sterile manner. A surgical  time-out was performed.  A Foley catheter was inserted into her bladder and attached to constant drainage and a V-Care uterine manipulator was then advanced into the uterus and a good fit around the cervix was noted. The gloves were changed, and attention was turned to the abdomen where an umbilical incision was made with the scalpel.  A 5mm trochar was inserted in the umbilical incision using a visiport method.Opening pressure was 4mm Hg, and the abdomen was insufflated to 15mg Hg carbon dioxide gas and adequate pneumoperitoneum was obtained. A survey of the patient's pelvis and abdomen revealed the findings as mentioned above. Two 5mm ports were inserted in the lower left and right quadrants under visualization.    The bilateral fallopian tubes were separated from the mesosalpinx using the Ligasure. The bilateral round ligaments were transected and anterior broad ligament divided and brought across the uterus to separate the vesicouterine peritoneum and create a bladder flap. The bladder was pushed away from the uterus. The bilateral uterine arteries were skeletonized, ligated and transected. The bilateral uterosacral and cardinal ligaments were ligated and transected. A colpotomy was made around the V-Care cervical cup and the uterus, cervix, and bilateral tubes were removed through the vagina. The vaginal cuff was closed vaginally using 0-Vicryl in a running locking stitch. This was tested for integrity using the surgeon's finger. After a change of gloves, the pneumoperitoneum was recreated and surgical site inspected, and found to be hemostatic. Bilateral ureters were visualized vermiuclating. No intraoperative injury to surrounding organs was noted. The abdomen was desufflated and all instruments were then removed.   All skin incisions were closed with 4-0 monocryl and covered with surgical glue. The patient tolerated the procedures well.  All instruments, needles, and sponge counts were correct  x 2. The patient was  taken to the recovery room in stable condition.   ---- Larey Days, MD Attending Obstetrician and [facility]

## 2016-09-26 NOTE — Anesthesia Procedure Notes (Signed)
Procedure Name: Intubation Performed by: Hamp Moreland Pre-anesthesia Checklist: Patient identified, Patient being monitored, Timeout performed, Emergency Drugs available and Suction available Patient Re-evaluated:Patient Re-evaluated prior to inductionOxygen Delivery Method: Circle system utilized Preoxygenation: Pre-oxygenation with 100% oxygen Intubation Type: IV induction Ventilation: Mask ventilation without difficulty Laryngoscope Size: Miller and 2 Grade View: Grade I Tube type: Oral Tube size: 7.0 mm Number of attempts: 1 Placement Confirmation: ETT inserted through vocal cords under direct vision,  positive ETCO2 and breath sounds checked- equal and bilateral Secured at: 21 cm Tube secured with: Tape Dental Injury: Teeth and Oropharynx as per pre-operative assessment        

## 2016-09-29 LAB — SURGICAL PATHOLOGY

## 2016-11-11 DIAGNOSIS — Z7982 Long term (current) use of aspirin: Secondary | ICD-10-CM | POA: Insufficient documentation

## 2016-11-11 DIAGNOSIS — F410 Panic disorder [episodic paroxysmal anxiety] without agoraphobia: Secondary | ICD-10-CM | POA: Insufficient documentation

## 2016-11-11 DIAGNOSIS — Z5321 Procedure and treatment not carried out due to patient leaving prior to being seen by health care provider: Secondary | ICD-10-CM | POA: Insufficient documentation

## 2016-11-11 DIAGNOSIS — F17210 Nicotine dependence, cigarettes, uncomplicated: Secondary | ICD-10-CM | POA: Diagnosis not present

## 2016-11-11 NOTE — ED Triage Notes (Signed)
Pt reports to ED w/ c/o panic attack earlier today. Pt sts that she is out of antianxiety medications. Pt calm in triage, resp even and unlabored. Ambulatory to triage w/o issue. Pt denies SI/HI, drugs or alcohol.  Sts that she is currently feeling SOB and has CP. NAD.

## 2016-11-12 ENCOUNTER — Emergency Department
Admission: EM | Admit: 2016-11-12 | Discharge: 2016-11-12 | Disposition: A | Discharge status: Home / Self Care | Payer: Medicaid Other | Attending: Emergency Medicine | Admitting: Emergency Medicine

## 2016-11-12 NOTE — ED Notes (Signed)
No answer when called from lobby 

## 2017-02-23 ENCOUNTER — Other Ambulatory Visit
Admission: RE | Admit: 2017-02-23 | Discharge: 2017-02-23 | Disposition: A | Discharge status: Home / Self Care | Payer: Medicaid Other | Source: Ambulatory Visit | Attending: Nurse Practitioner | Admitting: Nurse Practitioner

## 2017-02-23 DIAGNOSIS — K580 Irritable bowel syndrome with diarrhea: Secondary | ICD-10-CM | POA: Insufficient documentation

## 2017-02-23 LAB — GASTROINTESTINAL PANEL BY PCR, STOOL (REPLACES STOOL CULTURE)

## 2017-02-23 LAB — C DIFFICILE QUICK SCREEN W PCR REFLEX
C Diff antigen: NEGATIVE
C Diff interpretation: NOT DETECTED
C Diff toxin: NEGATIVE

## 2017-02-26 LAB — PANCREATIC ELASTASE, FECAL: Pancreatic Elastase-1, Stool: >500 ug Elast./g (ref >200)

## 2017-05-07 ENCOUNTER — Encounter: Payer: Self-pay | Admitting: Emergency Medicine

## 2017-05-07 ENCOUNTER — Emergency Department
Admission: EM | Admit: 2017-05-07 | Discharge: 2017-05-07 | Disposition: A | Discharge status: Home / Self Care | Payer: Medicaid Other | Attending: Emergency Medicine | Admitting: Emergency Medicine

## 2017-05-07 DIAGNOSIS — H5711 Ocular pain, right eye: Secondary | ICD-10-CM | POA: Diagnosis present

## 2017-05-07 DIAGNOSIS — H1089 Other conjunctivitis: Secondary | ICD-10-CM | POA: Diagnosis not present

## 2017-05-07 DIAGNOSIS — F17210 Nicotine dependence, cigarettes, uncomplicated: Secondary | ICD-10-CM | POA: Insufficient documentation

## 2017-05-07 DIAGNOSIS — H109 Unspecified conjunctivitis: Secondary | ICD-10-CM

## 2017-05-07 MED ORDER — POLYMYXIN B-TRIMETHOPRIM 10000-0.1 UNIT/ML-% OP SOLN: 2.0000 [drp] | Freq: Four times a day (QID) | OPHTHALMIC | 0 refills | Status: AC

## 2017-05-07 MED ORDER — FLUORESCEIN SODIUM 0.6 MG OP STRP
1.0000 | ORAL_STRIP | Freq: Once | OPHTHALMIC | Status: AC
  Administered 2017-05-07: 1 via OPHTHALMIC
  Filled 2017-05-07: qty 1

## 2017-05-07 MED ORDER — TETRACAINE HCL 0.5 % OP SOLN
2.0000 [drp] | Freq: Once | OPHTHALMIC | Status: AC
  Administered 2017-05-07: 2 [drp] via OPHTHALMIC
  Filled 2017-05-07: qty 4

## 2017-05-07 MED ORDER — KETOROLAC TROMETHAMINE 0.5 % OP SOLN: 1.0000 [drp] | Freq: Four times a day (QID) | OPHTHALMIC | 0 refills | Status: AC

## 2017-05-07 NOTE — ED Triage Notes (Signed)
Pt ambulatory to triage with steady gait, no distress noted. Pt c/o right eye pain, discharge and redness x3 days. Pt denies injury to eye. On assessment, pts right eye is red with clear drainage.

## 2017-05-07 NOTE — ED Provider Notes (Signed)
[facility] Emergency Department Provider Note  ____________________________________________  Time seen: Approximately 7:42 PM  I have reviewed the triage vital signs and the nursing notes.   HISTORY  Chief Complaint Conjunctivitis    HPI Erika Sanders is a 26 y.o. female who presents emergency department complaining of right eye pain, watering and drainage, redness 2 days. Patient reports that symptoms began with itching/burning and mild tearing of the eye. Symptoms have worsened through today. Patient does wear contacts but is wearing glasses today. She will contact yesterday. She denies any visual changes. No direct trauma or injury to the eye. No other complaints at this time. No medications prior to arrival.   Past Medical History:  Diagnosis Date  . Alternating constipation and diarrhea   . Anemia 2015   during pregnancy  . Anxiety   . Depression   . IBS (irritable bowel syndrome)   . Immune deficiency disorder [facility])     Patient Active Problem List   Diagnosis Date Noted  . Nausea and vomiting 01/27/2016  . Abdominal pain, left lower quadrant 01/19/2015  . N&V (nausea and vomiting) 01/19/2015  . Abdominal pain, generalized 01/19/2015  . Alternating constipation and diarrhea 01/19/2015  . Anxiety 08/28/2014  . History of anemia 08/25/2014  . Headache, migraine 06/27/2014  . Numbness and tingling 05/09/2014  . Vision disturbance 05/09/2014    Past Surgical History:  Procedure Laterality Date  . ABDOMINAL HYSTERECTOMY     09/26/16  . adenoidnectomy Bilateral   . APPENDECTOMY    . COLONOSCOPY WITH PROPOFOL N/A 05/07/2015   Procedure: COLONOSCOPY WITH PROPOFOL;  Surgeon: Scot Jun, MD;  Location[facility] ENDOSCOPY;  Service: Endoscopy;  Laterality: N/A;  . ESOPHAGOGASTRODUODENOSCOPY (EGD) WITH PROPOFOL N/A 05/07/2015   Procedure: ESOPHAGOGASTRODUODENOSCOPY (EGD) WITH PROPOFOL;  Surgeon: Scot Jun, MD;  Location: [facility]  ENDOSCOPY;  Service: Endoscopy;  Laterality: N/A;  . LAPAROSCOPIC BILATERAL SALPINGECTOMY Bilateral 09/26/2016   Procedure: LAPAROSCOPIC BILATERAL SALPINGECTOMY;  Surgeon: Elenora Fender Ward, MD;  Location: [facility] ORS;  Service: Gynecology;  Laterality: Bilateral;  . LAPAROSCOPIC HYSTERECTOMY N/A 09/26/2016   Procedure: HYSTERECTOMY TOTAL LAPAROSCOPIC;  Surgeon: Elenora Fender Ward, MD;  Location: [facility] ORS;  Service: Gynecology;  Laterality: N/A;  . TONSILLECTOMY    . WISDOM TOOTH EXTRACTION      Prior to Admission medications   Medication Sig Start Date End Date Taking? Authorizing Provider  acetaminophen (TYLENOL) 500 MG tablet Take 1 tablet (500 mg total) by mouth every 6 (six) hours as needed. 09/26/16   Ward, Elenora Fender, MD  aspirin-acetaminophen-caffeine (EXCEDRIN MIGRAINE) [id number] MG tablet Take 2 tablets by mouth every 8 (eight) hours as needed for migraine.    [provider]  ibuprofen (ADVIL,MOTRIN) 600 MG tablet Take 1 tablet (600 mg total) by mouth every 6 (six) hours as needed. 09/26/16   Ward, Elenora Fender, MD  ketorolac (ACULAR) 0.5 % ophthalmic solution Place 1 drop into the right eye 4 (four) times daily. 05/07/17   Cuthriell, Delorise Royals, PA-C  oxyCODONE (ROXICODONE) 5 MG immediate release tablet Take 1 tablet (5 mg total) by mouth every 4 (four) hours as needed for severe pain. 09/26/16   Ward, Elenora Fender, MD  trimethoprim-polymyxin b (POLYTRIM) ophthalmic solution Place 2 drops into the right eye every 6 (six) hours. 05/07/17   Cuthriell, Delorise Royals, PA-C    Allergies Lactase; Lactose; and Other  Family History  Problem Relation Age of Onset  . Aneurysm Other   . Hypertension Mother   .  Hypercholesterolemia Mother   . Hypertension Father   . Hypercholesterolemia Father   . Stroke Paternal Aunt     Social History Social History  Substance Use Topics  . Smoking status: Current Every Day Smoker    Packs/day: 0.50    Years: 2.00    Types: Cigarettes  . Smokeless tobacco:  Never Used  . Alcohol use No     Comment: seldom     Review of Systems  Constitutional: No fever/chills Eyes: No visual changes. No discharge. Positive for pain, redness, drainage from the right eye. ENT: No upper respiratory complaints. Cardiovascular: no chest pain. Respiratory: no cough. No SOB. Gastrointestinal: No abdominal pain.  No nausea, no vomiting.  Musculoskeletal: Negative for musculoskeletal pain. Skin: Negative for rash, abrasions, lacerations, ecchymosis. Neurological: Negative for headaches, focal weakness or numbness. 10-point ROS otherwise negative.  ____________________________________________   PHYSICAL EXAM:  VITAL SIGNS: ED Triage Vitals  Enc Vitals Group     BP 05/07/17 1929 117/76     Pulse Rate 05/07/17 1929 77     Resp 05/07/17 1929 15     Temp 05/07/17 1929 98 F (36.7 C)     Temp Source 05/07/17 1929 Oral     SpO2 05/07/17 1929 99 %     Weight 05/07/17 1929 117 lb (53.1 kg)     Height --      Head Circumference --      Peak Flow --      Pain Score 05/07/17 1928 6     Pain Loc --      Pain Edu? --      Excl. in GC? --      Constitutional: Alert and oriented. Well appearing and in no acute distress. Eyes: Conjunctivae are normal. PERRL. EOMI.Funduscopic exam reveals red reflex bilaterally. No foreign body. Good vasculature and optic disc. Eyes anesthetized using tetracaine. Fluorescein staining is applied. No areas of uptake. Head: Atraumatic. ENT:      Ears:       Nose: No congestion/rhinnorhea.      Mouth/Throat: Mucous membranes are moist.  Neck: No stridor.    Cardiovascular: Normal rate, regular rhythm. Normal S1 and S2.  Good peripheral circulation. Respiratory: Normal respiratory effort without tachypnea or retractions. Lungs CTAB. Good air entry to the bases with no decreased or absent breath sounds. Musculoskeletal: Full range of motion to all extremities. No gross deformities appreciated. Neurologic:  Normal speech and  language. No gross focal neurologic deficits are appreciated.  Skin:  Skin is warm, dry and intact. No rash noted. Psychiatric: Mood and affect are normal. Speech and behavior are normal. Patient exhibits appropriate insight and judgement.   ____________________________________________   LABS (all labs ordered are listed, but only abnormal results are displayed)  Labs Reviewed - No data to display ____________________________________________  EKG   ____________________________________________  RADIOLOGY   No results found.  ____________________________________________    PROCEDURES  Procedure(s) performed:    Procedures    Medications  fluorescein ophthalmic strip 1 strip (not administered)  tetracaine (PONTOCAINE) 0.5 % ophthalmic solution 2 drop (not administered)     ____________________________________________   INITIAL IMPRESSION / ASSESSMENT AND PLAN / ED COURSE  Pertinent labs & imaging results that were available during my care of the patient were reviewed by me and considered in my medical decision making (see chart for details).  Review of the  [place]RS was performed in accordance of the [facility] prior to dispensing any controlled drugs.     Patient's diagnosis  is consistent with bacterial conjunctivitis of the right eye.. Patient will be discharged home with prescriptions for antibiotic eyedrops and Acular. Patient is to follow up with ophthalmology as needed or otherwise directed. Patient is given ED precautions to return to the ED for any worsening or new symptoms.     ____________________________________________  FINAL CLINICAL IMPRESSION(S) / ED DIAGNOSES  Final diagnoses:  Bacterial conjunctivitis      NEW MEDICATIONS STARTED DURING THIS VISIT:  New Prescriptions   KETOROLAC (ACULAR) 0.5 % OPHTHALMIC SOLUTION    Place 1 drop into the right eye 4 (four) times daily.   TRIMETHOPRIM-POLYMYXIN B (POLYTRIM) OPHTHALMIC SOLUTION    Place 2  drops into the right eye every 6 (six) hours.        This chart was dictated using voice recognition software/Dragon. Despite best efforts to proofread, errors can occur which can change the meaning. Any change was purely unintentional.    Racheal PatchesCuthriell, Jonathan D, PA-C 05/07/17 1957    Rockne MenghiniNorman, Anne-Caroline, MD 05/07/17 [phone]

## 2017-05-07 NOTE — ED Notes (Signed)
Pt states that right eye started itching 3 days ago and this morning she woke up to her eye red, swollen, and watery. Family at the bedside.

## 2017-05-07 NOTE — ED Notes (Signed)

## 2017-07-05 ENCOUNTER — Emergency Department
Admission: EM | Admit: 2017-07-05 | Discharge: 2017-07-05 | Disposition: A | Discharge status: Home / Self Care | Payer: Medicaid Other | Attending: Emergency Medicine | Admitting: Emergency Medicine

## 2017-07-05 ENCOUNTER — Encounter: Payer: Self-pay | Admitting: Emergency Medicine

## 2017-07-05 DIAGNOSIS — F419 Anxiety disorder, unspecified: Secondary | ICD-10-CM | POA: Diagnosis not present

## 2017-07-05 DIAGNOSIS — J069 Acute upper respiratory infection, unspecified: Secondary | ICD-10-CM

## 2017-07-05 DIAGNOSIS — J399 Disease of upper respiratory tract, unspecified: Secondary | ICD-10-CM | POA: Diagnosis not present

## 2017-07-05 DIAGNOSIS — F329 Major depressive disorder, single episode, unspecified: Secondary | ICD-10-CM | POA: Insufficient documentation

## 2017-07-05 DIAGNOSIS — F17210 Nicotine dependence, cigarettes, uncomplicated: Secondary | ICD-10-CM | POA: Insufficient documentation

## 2017-07-05 DIAGNOSIS — R05 Cough: Secondary | ICD-10-CM | POA: Diagnosis present

## 2017-07-05 MED ORDER — BENZONATATE 100 MG PO CAPS: 100.0000 mg | ORAL_CAPSULE | Freq: Three times a day (TID) | ORAL | 0 refills | Status: AC | PRN

## 2017-07-05 MED ORDER — IPRATROPIUM-ALBUTEROL 0.5-2.5 (3) MG/3ML IN SOLN
RESPIRATORY_TRACT | Status: AC
  Administered 2017-07-05: 3 mL via RESPIRATORY_TRACT
  Filled 2017-07-05: qty 3

## 2017-07-05 MED ORDER — PREDNISONE 10 MG PO TABS: ORAL_TABLET | ORAL | 0 refills | Status: AC

## 2017-07-05 MED ORDER — AZITHROMYCIN 250 MG PO TABS: ORAL_TABLET | ORAL | 0 refills | Status: AC

## 2017-07-05 MED ORDER — IPRATROPIUM-ALBUTEROL 0.5-2.5 (3) MG/3ML IN SOLN
3.0000 mL | Freq: Once | RESPIRATORY_TRACT | Status: AC
  Administered 2017-07-05: 3 mL via RESPIRATORY_TRACT

## 2017-07-05 NOTE — ED Triage Notes (Addendum)
Pt c/o cold symptoms and sinus congestion worse since yesterday, pt states pain when taking deep breath in and when coughing. Pt states she has been having a chronic cough for several months. Cough is productive at times.  Denies any fevers. Pt has had 2 sinus infections since August.

## 2017-07-05 NOTE — ED Notes (Signed)
Patient reports cough x 2 months, congestion and body aches and fatigue since Friday.  Patient lying on her side in assessment room.  Patient denies fever.

## 2017-07-05 NOTE — ED Provider Notes (Signed)
[facility] Emergency Department Provider Note  ____________________________________________  Time seen: Approximately 11:13 AM  I have reviewed the triage vital signs and the nursing notes.   HISTORY  Chief Complaint Cough and Nasal Congestion    HPI Erika Sanders is a 26 y.o. female who presents to the emergency department for evaluation of non productive cough for 2 months and nasal congestion since yesterday. She feels like she cannot cough anything up. She smokes half a pack of cigarettes per day. No asthma or allergies. No fever, shortness of breath, chest pain, nausea, vomiting, abdominal pain.   Past Medical History:  Diagnosis Date  . Alternating constipation and diarrhea   . Anemia 2015   during pregnancy  . Anxiety   . Depression   . IBS (irritable bowel syndrome)   . Immune deficiency disorder [facility])     Patient Active Problem List   Diagnosis Date Noted  . Nausea and vomiting 01/27/2016  . Abdominal pain, left lower quadrant 01/19/2015  . N&V (nausea and vomiting) 01/19/2015  . Abdominal pain, generalized 01/19/2015  . Alternating constipation and diarrhea 01/19/2015  . Anxiety 08/28/2014  . History of anemia 08/25/2014  . Headache, migraine 06/27/2014  . Numbness and tingling 05/09/2014  . Vision disturbance 05/09/2014    Past Surgical History:  Procedure Laterality Date  . ABDOMINAL HYSTERECTOMY     09/26/16  . adenoidnectomy Bilateral   . APPENDECTOMY    . COLONOSCOPY WITH PROPOFOL N/A 05/07/2015   Procedure: COLONOSCOPY WITH PROPOFOL;  Surgeon: Scot Jun, MD;  Location: [facility] ENDOSCOPY;  Service: Endoscopy;  Laterality: N/A;  . ESOPHAGOGASTRODUODENOSCOPY (EGD) WITH PROPOFOL N/A 05/07/2015   Procedure: ESOPHAGOGASTRODUODENOSCOPY (EGD) WITH PROPOFOL;  Surgeon: Scot Jun, MD;  Location: [facility] ENDOSCOPY;  Service: Endoscopy;  Laterality: N/A;  . LAPAROSCOPIC BILATERAL SALPINGECTOMY Bilateral 09/26/2016   Procedure:  LAPAROSCOPIC BILATERAL SALPINGECTOMY;  Surgeon: Elenora Fender Ward, MD;  Location: [facility] ORS;  Service: Gynecology;  Laterality: Bilateral;  . LAPAROSCOPIC HYSTERECTOMY N/A 09/26/2016   Procedure: HYSTERECTOMY TOTAL LAPAROSCOPIC;  Surgeon: Elenora Fender Ward, MD;  Location: [facility] ORS;  Service: Gynecology;  Laterality: N/A;  . TONSILLECTOMY    . WISDOM TOOTH EXTRACTION      Prior to Admission medications   Medication Sig Start Date End Date Taking? Authorizing Provider  acetaminophen (TYLENOL) 500 MG tablet Take 1 tablet (500 mg total) by mouth every 6 (six) hours as needed. 09/26/16   Ward, Elenora Fender, MD  aspirin-acetaminophen-caffeine (EXCEDRIN MIGRAINE) [phone] MG tablet Take 2 tablets by mouth every 8 (eight) hours as needed for migraine.    [provider]  azithromycin (ZITHROMAX Z-PAK) 250 MG tablet Take 2 tablets (500 mg) on  Day 1,  followed by 1 tablet (250 mg) once daily on Days 2 through 5. 07/05/17   Enid Derry, PA-C  benzonatate (TESSALON PERLES) 100 MG capsule Take 1 capsule (100 mg total) by mouth 3 (three) times daily as needed for cough. 07/05/17 07/05/18  Enid Derry, PA-C  ibuprofen (ADVIL,MOTRIN) 600 MG tablet Take 1 tablet (600 mg total) by mouth every 6 (six) hours as needed. 09/26/16   Ward, Elenora Fender, MD  ketorolac (ACULAR) 0.5 % ophthalmic solution Place 1 drop into the right eye 4 (four) times daily. 05/07/17   Cuthriell, Delorise Royals, PA-C  oxyCODONE (ROXICODONE) 5 MG immediate release tablet Take 1 tablet (5 mg total) by mouth every 4 (four) hours as needed for severe pain. 09/26/16   Ward, Elenora Fender, MD  predniSONE (  DELTASONE) 10 MG tablet Take 6 tablets on day 1, take 5 tablets on day 2, take 4 tablets on day 3, take 3 tablets on day 4, take 2 tablets on day 5, take 1 tablet on day 6 07/05/17   Enid DerryWagner, Adin Lariccia, PA-C  trimethoprim-polymyxin b (POLYTRIM) ophthalmic solution Place 2 drops into the right eye every 6 (six) hours. 05/07/17   Cuthriell, Delorise RoyalsJonathan D, PA-C     Allergies Lactase; Lactose; and Other  Family History  Problem Relation Age of Onset  . Aneurysm Other   . Hypertension Mother   . Hypercholesterolemia Mother   . Hypertension Father   . Hypercholesterolemia Father   . Stroke Paternal Aunt     Social History Social History  Substance Use Topics  . Smoking status: Current Every Day Smoker    Packs/day: 0.50    Years: 2.00    Types: Cigarettes  . Smokeless tobacco: Never Used  . Alcohol use No     Comment: seldom     Review of Systems  Constitutional: No fever/chills Eyes: No visual changes. No discharge. ENT: Positive for congestion and rhinorrhea. Cardiovascular: No chest pain. Respiratory: Positive for cough. No SOB. Gastrointestinal: No abdominal pain.  No nausea, no vomiting.  No diarrhea.  No constipation. Musculoskeletal: Negative for musculoskeletal pain. Skin: Negative for rash, abrasions, lacerations, ecchymosis. Neurological: Negative for headaches.   ____________________________________________   PHYSICAL EXAM:  VITAL SIGNS: ED Triage Vitals  Enc Vitals Group     BP 07/05/17 1020 102/71     Pulse Rate 07/05/17 1020 (!) 113     Resp 07/05/17 1020 18     Temp 07/05/17 1020 98.2 F (36.8 C)     Temp Source 07/05/17 1020 Oral     SpO2 07/05/17 1020 98 %     Weight 07/05/17 1022 110 lb (49.9 kg)     Height 07/05/17 1022 5\' 1"  (1.549 m)     Head Circumference --      Peak Flow --      Pain Score --      Pain Loc --      Pain Edu? --      Excl. in GC? --      Constitutional: Alert and oriented. Well appearing and in no acute distress. Eyes: Conjunctivae are normal. PERRL. EOMI. No discharge. Head: Atraumatic. ENT: No frontal and maxillary sinus tenderness.      Ears: Tympanic membranes pearly gray with good landmarks. No discharge.      Nose: Mild congestion/rhinnorhea.      Mouth/Throat: Mucous membranes are moist. Oropharynx non-erythematous. Tonsils not enlarged. No exudates. Uvula  midline. Neck: No stridor.   Hematological/Lymphatic/Immunilogical: No cervical lymphadenopathy. Cardiovascular: Normal rate, regular rhythm.  Good peripheral circulation. Respiratory: Normal respiratory effort without tachypnea or retractions. Lungs CTAB. Good air entry to the bases with no decreased or absent breath sounds. Gastrointestinal: Bowel sounds 4 quadrants. Soft and nontender to palpation. No guarding or rigidity. No palpable masses. No distention. Musculoskeletal: Full range of motion to all extremities. No gross deformities appreciated. Neurologic:  Normal speech and language. No gross focal neurologic deficits are appreciated.  Skin:  Skin is warm, dry and intact. No rash noted.   ____________________________________________   LABS (all labs ordered are listed, but only abnormal results are displayed)  Labs Reviewed - No data to display ____________________________________________  EKG   ____________________________________________  RADIOLOGY   No results found.  ____________________________________________    PROCEDURES  Procedure(s) performed:    Procedures  Medications  ipratropium-albuterol (DUONEB) 0.5-2.5 (3) MG/3ML nebulizer solution 3 mL (3 mLs Nebulization Given 07/05/17 1221)     ____________________________________________   INITIAL IMPRESSION / ASSESSMENT AND PLAN / ED COURSE  Pertinent labs & imaging results that were available during my care of the patient were reviewed by me and considered in my medical decision making (see chart for details).  Review of the [place] CSRS was performed in accordance of the [facility] prior to dispensing any controlled drugs.     Patient's diagnosis is consistent with upper respiratory infection. Vital signs and exam are reassuring. Patient appears well and is staying well hydrated. Patient feels comfortable going home. DuoNeb was given. Patient will be discharged home with prescriptions for azithromycin,  prednisone, Tessalon Perles. Patient is to follow up with PCP as needed or otherwise directed. Patient is given ED precautions to return to the ED for any worsening or new symptoms.     ____________________________________________  FINAL CLINICAL IMPRESSION(S) / ED DIAGNOSES  Final diagnoses:  Upper respiratory tract infection, unspecified type      NEW MEDICATIONS STARTED DURING THIS VISIT:  Discharge Medication List as of 07/05/2017 11:44 AM    START taking these medications   Details  azithromycin (ZITHROMAX Z-PAK) 250 MG tablet Take 2 tablets (500 mg) on  Day 1,  followed by 1 tablet (250 mg) once daily on Days 2 through 5., Print    benzonatate (TESSALON PERLES) 100 MG capsule Take 1 capsule (100 mg total) by mouth 3 (three) times daily as needed for cough., Starting Sun 07/05/2017, Until Mon 07/05/2018, Print    predniSONE (DELTASONE) 10 MG tablet Take 6 tablets on day 1, take 5 tablets on day 2, take 4 tablets on day 3, take 3 tablets on day 4, take 2 tablets on day 5, take 1 tablet on day 6, Print            This chart was dictated using voice recognition software/Dragon. Despite best efforts to proofread, errors can occur which can change the meaning. Any change was purely unintentional.    Enid Derry, PA-C 07/05/17 1645    Merrily Brittle, MD 07/06/17 [phone]

## 2018-02-11 ENCOUNTER — Encounter: Payer: Self-pay | Admitting: Emergency Medicine

## 2018-02-11 ENCOUNTER — Emergency Department
Admission: EM | Admit: 2018-02-11 | Discharge: 2018-02-11 | Disposition: A | Discharge status: Home / Self Care | Payer: Medicaid Other | Attending: Emergency Medicine | Admitting: Emergency Medicine

## 2018-02-11 DIAGNOSIS — R519 Headache, unspecified: Secondary | ICD-10-CM

## 2018-02-11 DIAGNOSIS — K625 Hemorrhage of anus and rectum: Secondary | ICD-10-CM | POA: Insufficient documentation

## 2018-02-11 DIAGNOSIS — F17210 Nicotine dependence, cigarettes, uncomplicated: Secondary | ICD-10-CM | POA: Insufficient documentation

## 2018-02-11 DIAGNOSIS — R51 Headache: Secondary | ICD-10-CM | POA: Insufficient documentation

## 2018-02-11 MED ORDER — PROCHLORPERAZINE EDISYLATE 10 MG/2ML IJ SOLN
10.0000 mg | Freq: Once | INTRAMUSCULAR | Status: AC
  Administered 2018-02-11: 10 mg via INTRAMUSCULAR
  Filled 2018-02-11: qty 2
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
## 2018-02-11 NOTE — ED Notes (Signed)
Informed RN that patient has been roomed and is ready for evaluation.  Patient in NAD at this time and call bell placed within reach.   
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
## 2018-04-13 IMAGING — US US PELVIS COMPLETE
1 series · 14 of 25 positions shown · non-contrast
Comparison: 02/11/2014

CLINICAL DATA: Pelvic pain for 4 days

EXAM:
TRANSABDOMINAL AND TRANSVAGINAL ULTRASOUND OF PELVIS
TECHNIQUE: Both transabdominal and transvaginal ultrasound examinations of the
pelvis were performed. Transabdominal technique was performed for
global imaging of the pelvis including uterus, ovaries, adnexal
regions, and pelvic cul-de-sac. It was necessary to proceed with
endovaginal exam following the transabdominal exam to visualize the
endometrium and ovaries.

[Series 1: us pelvis complete · 0.22mm/px · 14 of 74 slices shown]
[im 1/74]
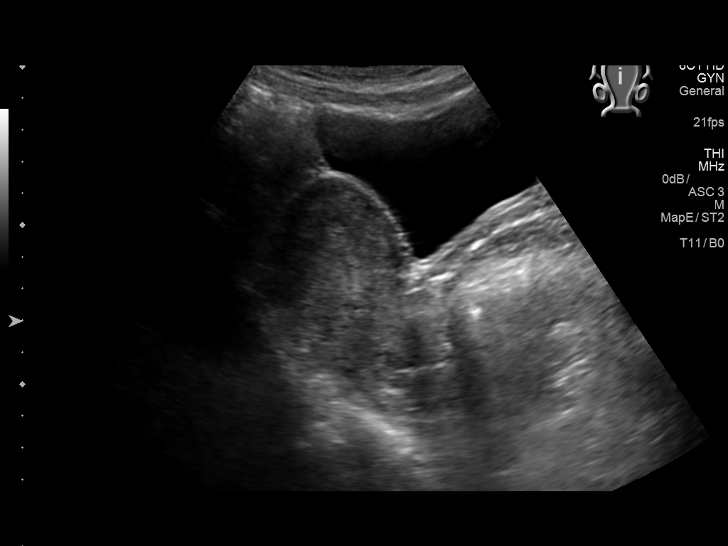
[im 7/74]
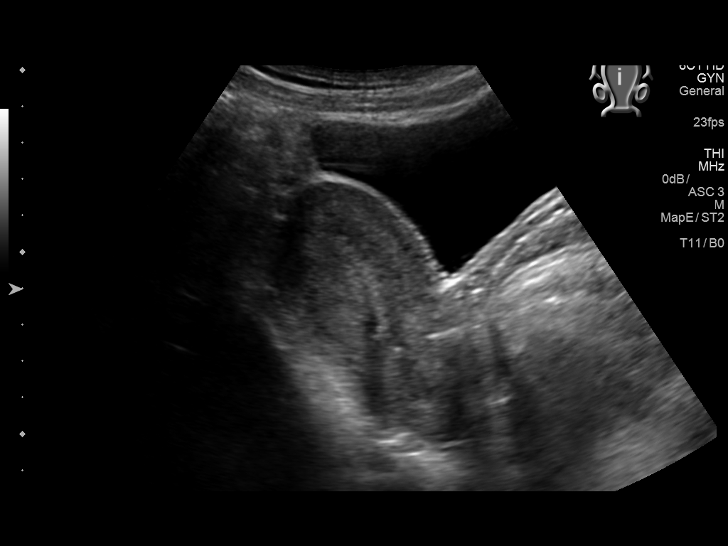
[im 13/74]
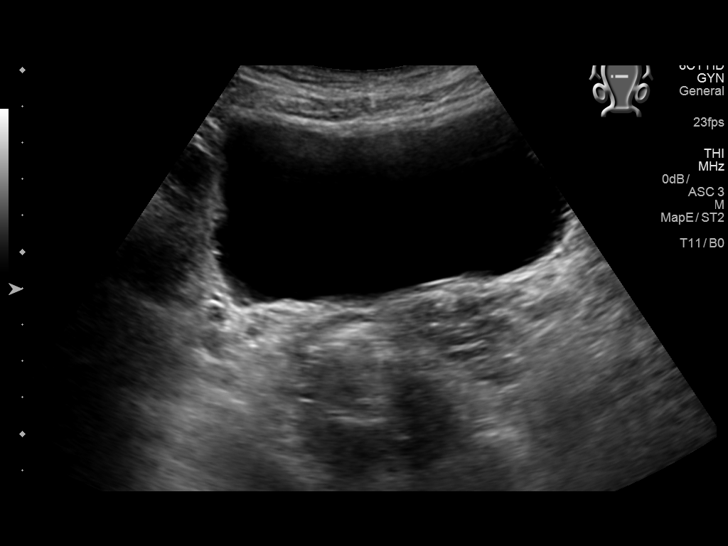
[im 19/74]
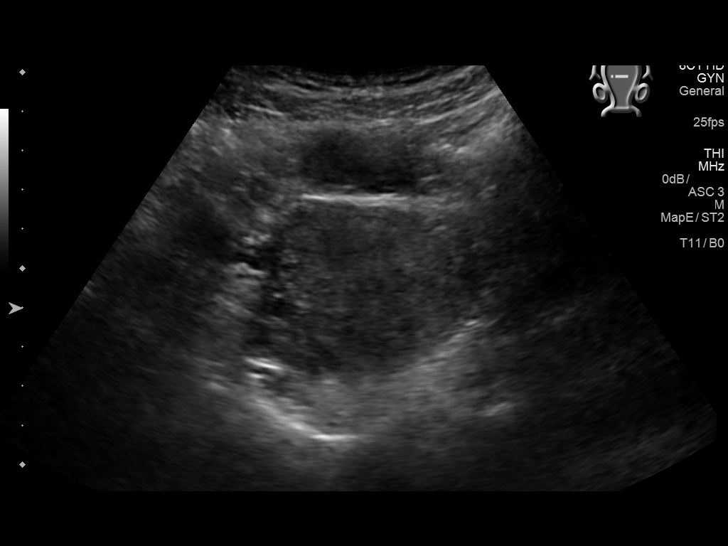
[im 25/74]
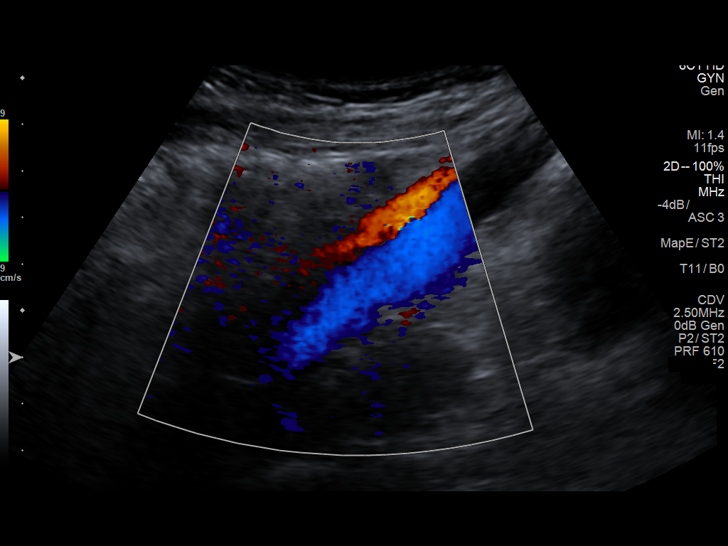
[im 28/74]
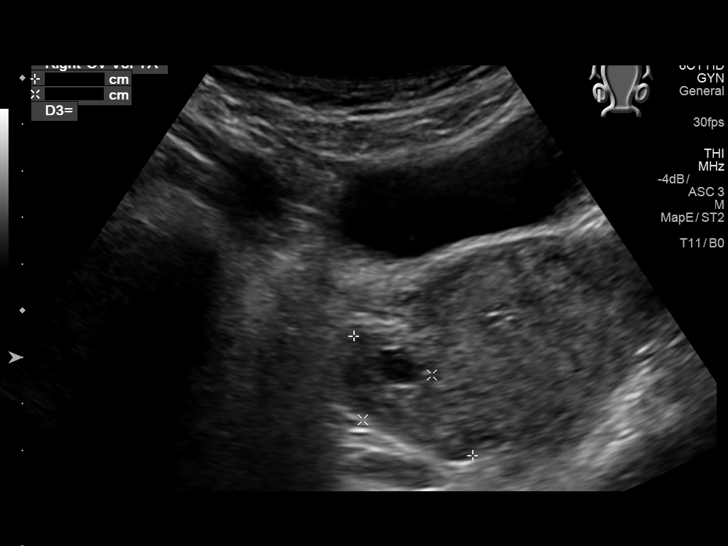
[im 34/74]
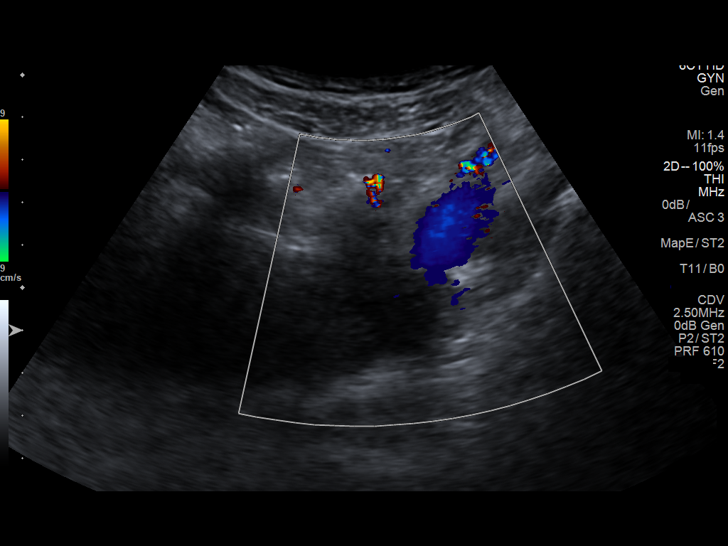
[im 40/74]
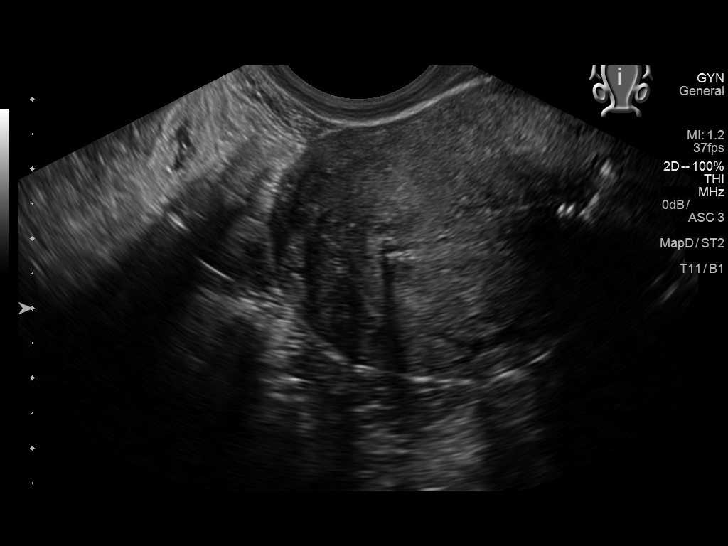
[im 46/74]
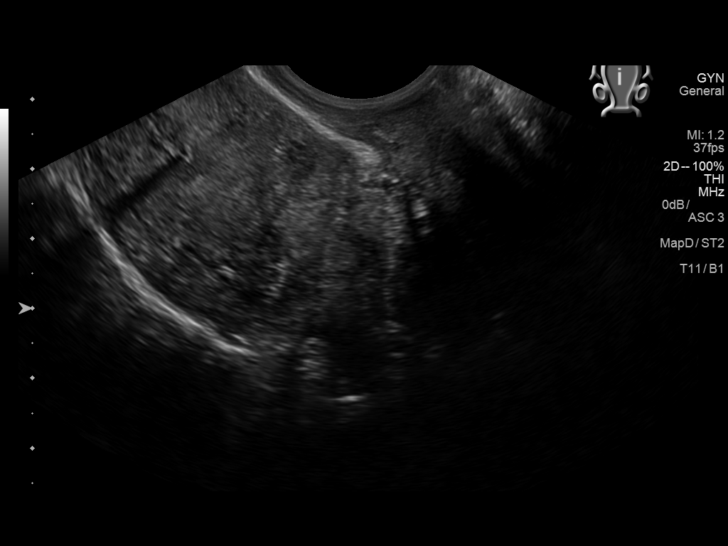
[im 49/74]
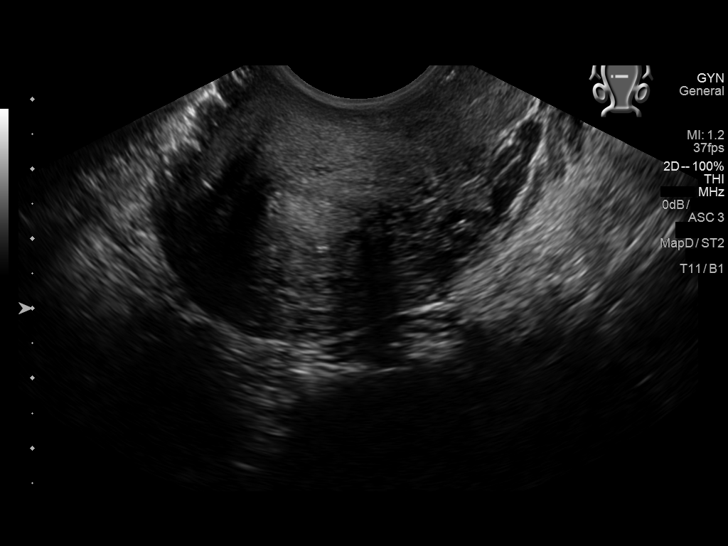
[im 55/74]
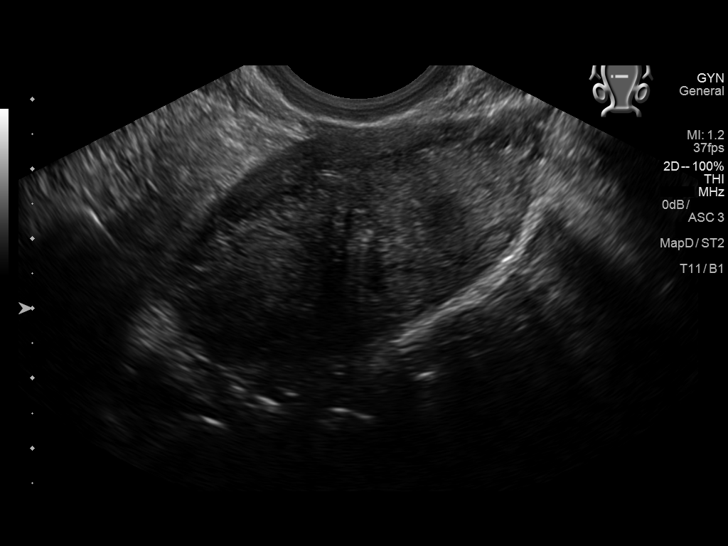
[im 61/74]
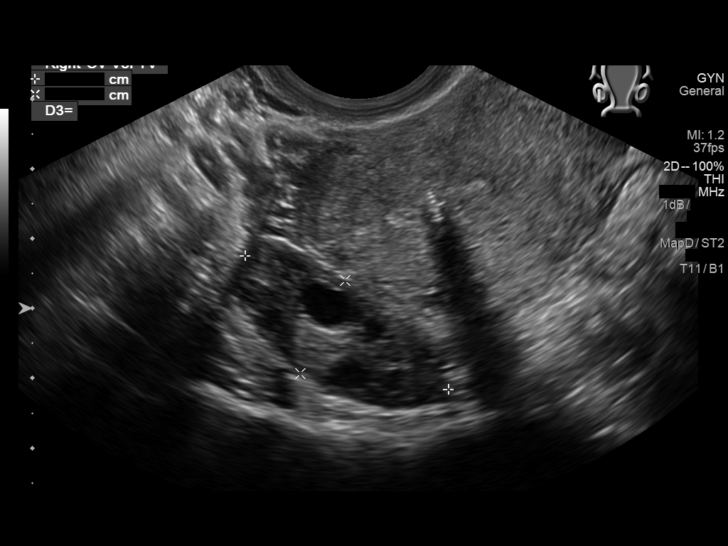
[im 67/74]
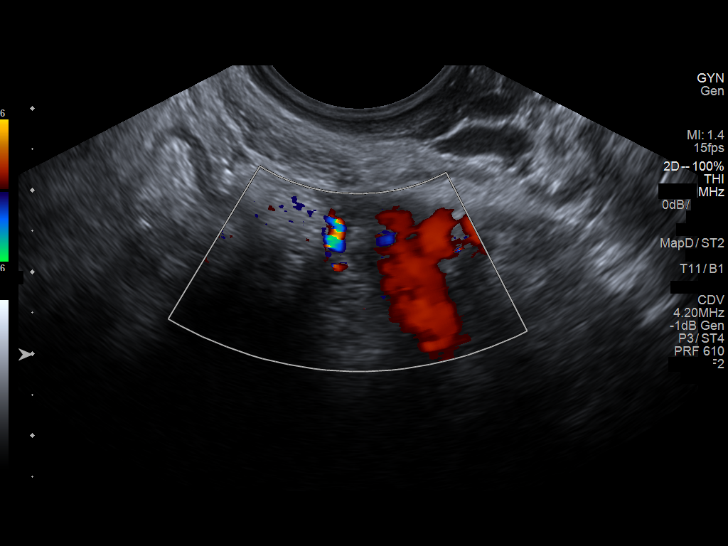
[im 74/74]
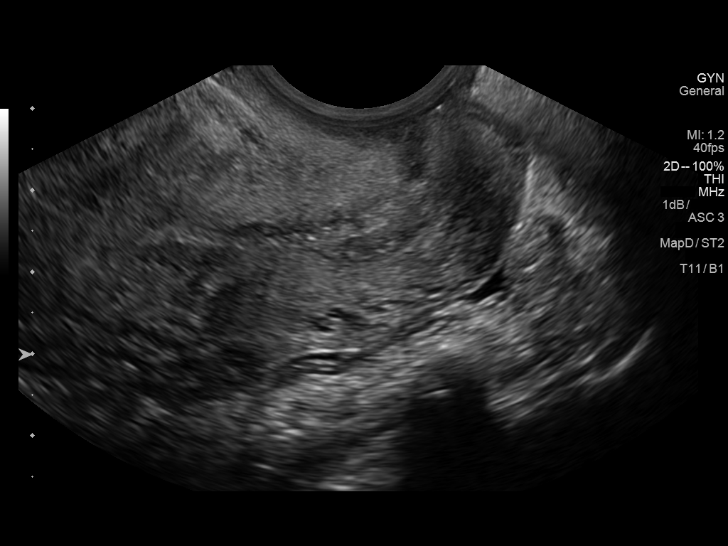

[14 of 25 positions shown; findings below may reference images not displayed]

FINDINGS: Uterus

Measurements: 7 x 4 x 5 cm. No fibroids or other mass visualized.

Endometrium

Thickness: 5 mm. No focal abnormality visualized. IUD in normal
position.

Right ovary

Measurements: 34 x 14 x 21 mm. Normal appearance/no adnexal mass.

Left ovary

Measurements: 27 x 16 x 16 mm. Normal appearance/no adnexal mass.

Other findings

No abnormal free fluid.
IMPRESSION: Normal pelvic ultrasound with IUD present.

## 2019-07-11 ENCOUNTER — Other Ambulatory Visit: Payer: Self-pay

## 2019-07-11 DIAGNOSIS — Z20822 Contact with and (suspected) exposure to covid-19: Secondary | ICD-10-CM

## 2019-07-12 LAB — NOVEL CORONAVIRUS, NAA: SARS-CoV-2, NAA: NOT DETECTED

## 2019-07-27 ENCOUNTER — Other Ambulatory Visit: Payer: Self-pay

## 2019-07-27 DIAGNOSIS — Z20822 Contact with and (suspected) exposure to covid-19: Secondary | ICD-10-CM

## 2019-07-29 LAB — NOVEL CORONAVIRUS, NAA: SARS-CoV-2, NAA: NOT DETECTED

## 2019-10-30 ENCOUNTER — Emergency Department
Admission: EM | Admit: 2019-10-30 | Discharge: 2019-10-30 | Disposition: A | Discharge status: Home / Self Care | Payer: Medicaid Other | Attending: Emergency Medicine | Admitting: Emergency Medicine

## 2019-10-30 ENCOUNTER — Encounter: Payer: Self-pay | Admitting: Emergency Medicine

## 2019-10-30 ENCOUNTER — Other Ambulatory Visit: Payer: Self-pay

## 2019-10-30 ENCOUNTER — Emergency Department: Payer: Medicaid Other

## 2019-10-30 DIAGNOSIS — X500XXD Overexertion from strenuous movement or load, subsequent encounter: Secondary | ICD-10-CM | POA: Insufficient documentation

## 2019-10-30 DIAGNOSIS — M25512 Pain in left shoulder: Secondary | ICD-10-CM | POA: Insufficient documentation

## 2019-10-30 DIAGNOSIS — M542 Cervicalgia: Secondary | ICD-10-CM

## 2019-10-30 DIAGNOSIS — F17210 Nicotine dependence, cigarettes, uncomplicated: Secondary | ICD-10-CM | POA: Insufficient documentation

## 2019-10-30 DIAGNOSIS — M25511 Pain in right shoulder: Secondary | ICD-10-CM | POA: Insufficient documentation

## 2019-10-30 MED ORDER — METAXALONE 800 MG PO TABS: 800.0000 mg | ORAL_TABLET | Freq: Three times a day (TID) | ORAL | 0 refills | Status: AC

## 2019-10-30 MED ORDER — KETOROLAC TROMETHAMINE 10 MG PO TABS: 10.0000 mg | ORAL_TABLET | Freq: Four times a day (QID) | ORAL | 0 refills | Status: AC | PRN

## 2019-10-30 MED ORDER — KETOROLAC TROMETHAMINE 30 MG/ML IJ SOLN
30.0000 mg | Freq: Once | INTRAMUSCULAR | Status: AC
  Administered 2019-10-30: 30 mg via INTRAMUSCULAR
  Filled 2019-10-30: qty 1
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
## 2019-10-30 NOTE — ED Notes (Signed)
Pt states she has been having shoulder pain for about a week that has progressively gotten worse and is now causing migraines- collar bone on right side notably more pronounced than left side

## 2019-10-30 NOTE — ED Provider Notes (Signed)
[facility] Emergency Department Provider Note  ____________________________________________  Time seen: Approximately 12:46 PM  I have reviewed the triage vital signs and the nursing notes.   HISTORY  Chief Complaint Neck Pain    HPI Erika Sanders is a 29 y.o. female that presents to the emergency department for evaluation of neck pain that radiates into bilateral shoulders for 1 month.  Patient states that pain initially started after lifting a 50 pound bag.  Patient states that her collarbone on the right side feels larger than on the left.  Pain is worse when she turns her head.  She feels like the pain starts in her spine and goes into both of her shoulders.  Pain is worse on the right.  She was seen at [facility] clinic on 1/17 and was started on anti-inflammatories and muscle relaxers.  No new trauma.  Patient took medications as prescribed, without any relief.  She did not follow-up with anyone following her urgent care visit.  She is unable to take prednisone, as it makes her jittery.   Past Medical History:  Diagnosis Date  . Alternating constipation and diarrhea   . Anemia 2015   during pregnancy  . Anxiety   . Depression   . IBS (irritable bowel syndrome)   . Immune deficiency disorder [facility])     Patient Active Problem List   Diagnosis Date Noted  . Nausea and vomiting 01/27/2016  . Abdominal pain, left lower quadrant 01/19/2015  . N&V (nausea and vomiting) 01/19/2015  . Abdominal pain, generalized 01/19/2015  . Alternating constipation and diarrhea 01/19/2015  . Anxiety 08/28/2014  . History of anemia 08/25/2014  . Headache, migraine 06/27/2014  . Numbness and tingling 05/09/2014  . Vision disturbance 05/09/2014    Past Surgical History:  Procedure Laterality Date  . ABDOMINAL HYSTERECTOMY     09/26/16  . adenoidnectomy Bilateral   . APPENDECTOMY    . COLONOSCOPY WITH PROPOFOL N/A 05/07/2015   Procedure: COLONOSCOPY WITH PROPOFOL;   Surgeon: Scot Jun, MD;  Location: [facility] ENDOSCOPY;  Service: Endoscopy;  Laterality: N/A;  . ESOPHAGOGASTRODUODENOSCOPY (EGD) WITH PROPOFOL N/A 05/07/2015   Procedure: ESOPHAGOGASTRODUODENOSCOPY (EGD) WITH PROPOFOL;  Surgeon: Scot Jun, MD;  Location: [facility] ENDOSCOPY;  Service: Endoscopy;  Laterality: N/A;  . LAPAROSCOPIC BILATERAL SALPINGECTOMY Bilateral 09/26/2016   Procedure: LAPAROSCOPIC BILATERAL SALPINGECTOMY;  Surgeon: Elenora Fender Ward, MD;  Location: [facility] ORS;  Service: Gynecology;  Laterality: Bilateral;  . LAPAROSCOPIC HYSTERECTOMY N/A 09/26/2016   Procedure: HYSTERECTOMY TOTAL LAPAROSCOPIC;  Surgeon: Elenora Fender Ward, MD;  Location: [facility] ORS;  Service: Gynecology;  Laterality: N/A;  . TONSILLECTOMY    . WISDOM TOOTH EXTRACTION      Prior to Admission medications   Medication Sig Start Date End Date Taking? Authorizing Provider  acetaminophen (TYLENOL) 500 MG tablet Take 1 tablet (500 mg total) by mouth every 6 (six) hours as needed. 09/26/16   Ward, Elenora Fender, MD  aspirin-acetaminophen-caffeine (EXCEDRIN MIGRAINE) [phone] MG tablet Take 2 tablets by mouth every 8 (eight) hours as needed for migraine.    [provider]  azithromycin (ZITHROMAX Z-PAK) 250 MG tablet Take 2 tablets (500 mg) on  Day 1,  followed by 1 tablet (250 mg) once daily on Days 2 through 5. 07/05/17   Enid Derry, PA-C  ibuprofen (ADVIL,MOTRIN) 600 MG tablet Take 1 tablet (600 mg total) by mouth every 6 (six) hours as needed. 09/26/16   Ward, Elenora Fender, MD  ketorolac (ACULAR) 0.5 % ophthalmic solution  Place 1 drop into the right eye 4 (four) times daily. 05/07/17   Cuthriell, Delorise Royals, PA-C  ketorolac (TORADOL) 10 MG tablet Take 1 tablet (10 mg total) by mouth every 6 (six) hours as needed. 10/30/19   Enid Derry, PA-C  metaxalone (SKELAXIN) 800 MG tablet Take 1 tablet (800 mg total) by mouth 3 (three) times daily. 10/30/19 10/29/20  Enid Derry, PA-C  oxyCODONE (ROXICODONE) 5 MG immediate  release tablet Take 1 tablet (5 mg total) by mouth every 4 (four) hours as needed for severe pain. 09/26/16   Ward, Elenora Fender, MD  predniSONE (DELTASONE) 10 MG tablet Take 6 tablets on day 1, take 5 tablets on day 2, take 4 tablets on day 3, take 3 tablets on day 4, take 2 tablets on day 5, take 1 tablet on day 6 07/05/17   Enid Derry, PA-C  trimethoprim-polymyxin b (POLYTRIM) ophthalmic solution Place 2 drops into the right eye every 6 (six) hours. 05/07/17   Cuthriell, Delorise Royals, PA-C    Allergies Prednisone, Lactase, Lactose, and Other  Family History  Problem Relation Age of Onset  . Aneurysm Other   . Hypertension Mother   . Hypercholesterolemia Mother   . Hypertension Father   . Hypercholesterolemia Father   . Stroke Paternal Aunt     Social History Social History   Tobacco Use  . Smoking status: Current Every Day Smoker    Packs/day: 0.50    Years: 2.00    Pack years: 1.00    Types: Cigarettes  . Smokeless tobacco: Never Used  Substance Use Topics  . Alcohol use: No    Comment: seldom  . Drug use: No     Review of Systems  Constitutional: No fever/chills Cardiovascular: No chest pain. Respiratory: No SOB. Gastrointestinal: No abdominal pain.  No nausea, no vomiting.  Musculoskeletal: Positive for neck and shoulder pain. Skin: Negative for rash, abrasions, lacerations, ecchymosis. Neurological: Negative for hnumbness or tingling   ____________________________________________   PHYSICAL EXAM:  VITAL SIGNS: ED Triage Vitals [10/30/19 1041]  Enc Vitals Group     BP 118/81     Pulse Rate 93     Resp 16     Temp 98.7 F (37.1 C)     Temp Source Oral     SpO2 100 %     Weight 120 lb (54.4 kg)     Height 5\' 1"  (1.549 m)     Head Circumference      Peak Flow      Pain Score 7     Pain Loc      Pain Edu?      Excl. in GC?      Constitutional: Alert and oriented. Well appearing and in no acute distress. Eyes: Conjunctivae are normal. PERRL.  EOMI. Head: Atraumatic. ENT:      Ears:      Nose: No congestion/rhinnorhea.      Mouth/Throat: Mucous membranes are moist.  Neck: No stridor. No cervical spine tenderness to palpation.  Mild tenderness to palpation to cervical paraspinal muscles. Cardiovascular: Normal rate, regular rhythm.  Good peripheral circulation.  Symmetric radial pulses bilaterally. Respiratory: Normal respiratory effort without tachypnea or retractions. Lungs CTAB. Good air entry to the bases with no decreased or absent breath sounds. Musculoskeletal: Full range of motion to all extremities. No gross deformities appreciated.  Strength equal in upper extremities bilaterally. Neurologic:  Normal speech and language. No gross focal neurologic deficits are appreciated.  Skin:  Skin is warm,  dry and intact. No rash noted. Psychiatric: Mood and affect are normal. Speech and behavior are normal. Patient exhibits appropriate insight and judgement.   ____________________________________________   LABS (all labs ordered are listed, but only abnormal results are displayed)  Labs Reviewed - No data to display ____________________________________________  EKG   ____________________________________________  RADIOLOGY Robinette Haines, personally viewed and evaluated these images (plain radiographs) as part of my medical decision making, as well as reviewing the written report by the radiologist.  DG Chest 2 View  Result Date: 10/30/2019 CLINICAL DATA:  Posterior neck pain radiating to the right shoulder. EXAM: CHEST - 2 VIEW COMPARISON:  PA and lateral chest 10/08/2008. FINDINGS: Lungs clear. Heart size normal. No pneumothorax or pleural fluid. No bony abnormality. IMPRESSION: Negative chest. Electronically Signed   By: Inge Rise M.D.   On: 10/30/2019 12:50   DG Cervical Spine 2-3 Views  Result Date: 10/30/2019 CLINICAL DATA:  Posterior neck pain radiating into the right shoulder. EXAM: CERVICAL SPINE - 2-3  VIEW COMPARISON:  None. FINDINGS: There is no evidence of cervical spine fracture or prevertebral soft tissue swelling. Alignment is normal. No other significant bone abnormalities are identified. IMPRESSION: Negative cervical spine radiographs. Electronically Signed   By: Inge Rise M.D.   On: 10/30/2019 12:49    ____________________________________________    PROCEDURES  Procedure(s) performed:    Procedures    Medications  ketorolac (TORADOL) 30 MG/ML injection 30 mg (has no administration in time range)     ____________________________________________   INITIAL IMPRESSION / ASSESSMENT AND PLAN / ED COURSE  Pertinent labs & imaging results that were available during my care of the patient were reviewed by me and considered in my medical decision making (see chart for details).  Review of the [place] CSRS was performed in accordance of the [place] prior to dispensing any controlled drugs.   Patient presented to emergency department for evaluation of continued neck pain following injury at work 1 month ago.  Vital signs and exam are reassuring.  X-rays are negative for acute abnormalities.  Patient was given Toradol for pain and inflammation.  Patient will be discharged home with prescriptions for Toradol and Skelaxin.  She will discontinue her as other muscle relaxer and anti-inflammatory.  Patient is to follow up with primary care, orthopedics, chiropractor as directed.  Referrals were given.  Patient is given ED precautions to return to the ED for any worsening or new symptoms.   KETRA DUCHESNE was evaluated in Emergency Department on 10/30/2019 for the symptoms described in the history of present illness. She was evaluated in the context of the global COVID-19 pandemic, which necessitated consideration that the patient might be at risk for infection with the SARS-CoV-2 virus that causes COVID-19. Institutional protocols and algorithms that pertain to the evaluation of patients  at risk for COVID-19 are in a state of rapid change based on information released by regulatory bodies including the CDC and federal and state organizations. These policies and algorithms were followed during the patient's care in the ED.  ____________________________________________  FINAL CLINICAL IMPRESSION(S) / ED DIAGNOSES  Final diagnoses:  Neck pain      NEW MEDICATIONS STARTED DURING THIS VISIT:  ED Discharge Orders         Ordered    ketorolac (TORADOL) 10 MG tablet  Every 6 hours PRN     10/30/19 1333    metaxalone (SKELAXIN) 800 MG tablet  3 times daily     10/30/19 1333  This chart was dictated using voice recognition software/Dragon. Despite best efforts to proofread, errors can occur which can change the meaning. Any change was purely unintentional.    Enid Derry, PA-C 10/30/19 1516    Concha Se, MD 11/03/19 [phone]

## 2020-01-28 ENCOUNTER — Other Ambulatory Visit: Payer: Self-pay

## 2020-01-28 ENCOUNTER — Emergency Department: Payer: Medicaid Other

## 2020-01-28 ENCOUNTER — Encounter: Payer: Self-pay | Admitting: Emergency Medicine

## 2020-01-28 ENCOUNTER — Emergency Department
Admission: EM | Admit: 2020-01-28 | Discharge: 2020-01-29 | Disposition: A | Discharge status: Home / Self Care | Payer: Medicaid Other | Attending: Emergency Medicine | Admitting: Emergency Medicine

## 2020-01-28 DIAGNOSIS — N6489 Other specified disorders of breast: Secondary | ICD-10-CM | POA: Insufficient documentation

## 2020-01-28 DIAGNOSIS — N6452 Nipple discharge: Secondary | ICD-10-CM

## 2020-01-28 DIAGNOSIS — E876 Hypokalemia: Secondary | ICD-10-CM

## 2020-01-28 LAB — CBC WITH DIFFERENTIAL/PLATELET
Abs Immature Granulocytes: 0.02 10*3/uL (ref 0.00–0.07)
Basophils Absolute: 0 10*3/uL (ref 0.0–0.1)
Basophils Relative: 1 %
Eosinophils Absolute: 0.1 10*3/uL (ref 0.0–0.5)
Eosinophils Relative: 1 %
HCT: 41.1 % (ref 36.0–46.0)
Hemoglobin: 14.2 g/dL (ref 12.0–15.0)
Immature Granulocytes: 0 %
Lymphocytes Relative: 48 %
Lymphs Abs: 4.1 10*3/uL — ABNORMAL HIGH (ref 0.7–4.0)
MCH: 29.6 pg (ref 26.0–34.0)
MCHC: 34.5 g/dL (ref 30.0–36.0)
MCV: 85.6 fL (ref 80.0–100.0)
Monocytes Absolute: 0.4 10*3/uL (ref 0.1–1.0)
Monocytes Relative: 4 %
Neutro Abs: 3.8 10*3/uL (ref 1.7–7.7)
Neutrophils Relative %: 46 %
Platelets: 166 10*3/uL (ref 150–400)
RBC: 4.8 MIL/uL (ref 3.87–5.11)
RDW: 12.1 % (ref 11.5–15.5)
WBC: 8.4 10*3/uL (ref 4.0–10.5)
nRBC: 0 % (ref 0.0–0.2)

## 2020-01-28 LAB — COMPREHENSIVE METABOLIC PANEL
ALT: 11 U/L (ref 0–44)
AST: 14 U/L — ABNORMAL LOW (ref 15–41)
Albumin: 4.3 g/dL (ref 3.5–5.0)
Alkaline Phosphatase: 57 U/L (ref 38–126)
Anion gap: 7 (ref 5–15)
BUN: <5 mg/dL — ABNORMAL LOW (ref 6–20)
CO2: 28 mmol/L (ref 22–32)
Calcium: 9.1 mg/dL (ref 8.9–10.3)
Chloride: 104 mmol/L (ref 98–111)
Creatinine, Ser: 0.6 mg/dL (ref 0.44–1.00)
GFR calc Af Amer: >60 mL/min (ref >60)
GFR calc non Af Amer: >60 mL/min (ref >60)
Glucose, Bld: 84 mg/dL (ref 70–99)
Potassium: 3.2 mmol/L — ABNORMAL LOW (ref 3.5–5.1)
Sodium: 139 mmol/L (ref 135–145)
Total Bilirubin: 0.6 mg/dL (ref 0.3–1.2)
Total Protein: 6.4 g/dL — ABNORMAL LOW (ref 6.5–8.1)

## 2020-01-28 LAB — LACTIC ACID, PLASMA: Lactic Acid, Venous: 0.8 mmol/L (ref 0.5–1.9)

## 2020-01-28 MED ORDER — POTASSIUM CHLORIDE CRYS ER 20 MEQ PO TBCR
40.0000 meq | EXTENDED_RELEASE_TABLET | Freq: Once | ORAL | Status: AC
  Administered 2020-01-29: 40 meq via ORAL
  Filled 2020-01-28: qty 2

## 2020-01-28 NOTE — ED Provider Notes (Signed)
[facility] Emergency Department Provider Note   ____________________________________________   First MD Initiated Contact with Patient 01/28/20 2327     (approximate)  I have reviewed the triage vital signs and the nursing notes.   HISTORY  Chief Complaint Abscess    HPI Erika Sanders is a 29 y.o. female who presents to the ED from home with a chief complaint of nipple discharge.  Patient states 1 year ago she felt cysts like structures in her breast.  Did not follow-up or have imaging done.  Last night she was squeezing her breasts and noted discharge mainly from the left breast.  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  Patient is not breast-feeding; her youngest child is 50 years old.  Denies breast pain, redness, warmth, skin changes.  Denies fever, chills, chest pain, shortness of breath, abdominal pain, nausea or vomiting.  No prior personal or family history of pituitary tumor.       Past Medical History:  Diagnosis Date  . Alternating constipation and diarrhea   . Anemia 2015   during pregnancy  . Anxiety   . Depression   . IBS (irritable bowel syndrome)   . Immune deficiency disorder [facility])     Patient Active Problem List   Diagnosis Date Noted  . Nausea and vomiting 01/27/2016  . Abdominal pain, left lower quadrant 01/19/2015  . N&V (nausea and vomiting) 01/19/2015  . Abdominal pain, generalized 01/19/2015  . Alternating constipation and diarrhea 01/19/2015  . Anxiety 08/28/2014  . History of anemia 08/25/2014  . Headache, migraine 06/27/2014  . Numbness and tingling 05/09/2014  . Vision disturbance 05/09/2014    Past Surgical History:  Procedure Laterality Date  . ABDOMINAL HYSTERECTOMY     09/26/16  . adenoidnectomy Bilateral   . APPENDECTOMY    . COLONOSCOPY WITH PROPOFOL N/A 05/07/2015   Procedure:  COLONOSCOPY WITH PROPOFOL;  Surgeon: Manya Silvas, MD;  Location: [facility] ENDOSCOPY;  Service: Endoscopy;  Laterality: N/A;  . ESOPHAGOGASTRODUODENOSCOPY (EGD) WITH PROPOFOL N/A 05/07/2015   Procedure: ESOPHAGOGASTRODUODENOSCOPY (EGD) WITH PROPOFOL;  Surgeon: Manya Silvas, MD;  Location: [facility] ENDOSCOPY;  Service: Endoscopy;  Laterality: N/A;  . LAPAROSCOPIC BILATERAL SALPINGECTOMY Bilateral 09/26/2016   Procedure: LAPAROSCOPIC BILATERAL SALPINGECTOMY;  Surgeon: Honor Loh Ward, MD;  Location: [facility] ORS;  Service: Gynecology;  Laterality: Bilateral;  . LAPAROSCOPIC HYSTERECTOMY N/A 09/26/2016   Procedure: HYSTERECTOMY TOTAL LAPAROSCOPIC;  Surgeon: Honor Loh Ward, MD;  Location: [facility] ORS;  Service: Gynecology;  Laterality: N/A;  . TONSILLECTOMY    . WISDOM TOOTH EXTRACTION      Prior to Admission medications   Medication Sig Start Date End Date Taking? Authorizing Provider  acetaminophen (TYLENOL) 500 MG tablet Take 1 tablet (500 mg total) by mouth every 6 (six) hours as needed. 09/26/16   Ward, Honor Loh, MD  amoxicillin (AMOXIL) 500 MG capsule Take 1 capsule (500 mg total) by mouth 3 (three) times daily. 01/29/20   Paulette Blanch, MD  aspirin-acetaminophen-caffeine (EXCEDRIN MIGRAINE) [phone] MG tablet Take 2 tablets by mouth every 8 (eight) hours as needed for migraine.    [provider]  azithromycin (ZITHROMAX Z-PAK) 250 MG tablet Take 2 tablets (500 mg) on  Day 1,  followed by 1 tablet (250 mg) once daily on Days 2 through 5. 07/05/17   Laban Emperor, PA-C  ibuprofen (ADVIL,MOTRIN) 600 MG tablet Take  1 tablet (600 mg total) by mouth every 6 (six) hours as needed. 09/26/16   Ward, Elenora Fender, MD  ketorolac (ACULAR) 0.5 % ophthalmic solution Place 1 drop into the right eye 4 (four) times daily. 05/07/17   Cuthriell, Delorise Royals, PA-C  ketorolac (TORADOL) 10 MG tablet Take 1 tablet (10 mg total) by mouth every 6 (six) hours as needed. 10/30/19   Enid Derry, PA-C  metaxalone (SKELAXIN)  800 MG tablet Take 1 tablet (800 mg total) by mouth 3 (three) times daily. 10/30/19 10/29/20  Enid Derry, PA-C  oxyCODONE (ROXICODONE) 5 MG immediate release tablet Take 1 tablet (5 mg total) by mouth every 4 (four) hours as needed for severe pain. 09/26/16   Ward, Elenora Fender, MD  predniSONE (DELTASONE) 10 MG tablet Take 6 tablets on day 1, take 5 tablets on day 2, take 4 tablets on day 3, take 3 tablets on day 4, take 2 tablets on day 5, take 1 tablet on day 6 07/05/17   Enid Derry, PA-C  trimethoprim-polymyxin b (POLYTRIM) ophthalmic solution Place 2 drops into the right eye every 6 (six) hours. 05/07/17   Cuthriell, Delorise Royals, PA-C    Allergies Prednisone, Lactase, Lactose, and Other  Family History  Problem Relation Age of Onset  . Aneurysm Other   . Hypertension Mother   . Hypercholesterolemia Mother   . Hypertension Father   . Hypercholesterolemia Father   . Stroke Paternal Aunt     Social History Social History   Tobacco Use  . Smoking status: Current Every Day Smoker    Packs/day: 0.50    Years: 2.00    Pack years: 1.00    Types: Cigarettes  . Smokeless tobacco: Never Used  Substance Use Topics  . Alcohol use: No    Comment: seldom  . Drug use: No    Review of Systems  Constitutional: No fever/chills Eyes: No visual changes. ENT: No sore throat. Cardiovascular: Denies chest pain. Respiratory: Denies shortness of breath. Gastrointestinal: No abdominal pain.  No nausea, no vomiting.  No diarrhea.  No constipation. Genitourinary: Negative for dysuria. Musculoskeletal: Negative for back pain. Skin: Positive for left breast discharge.  Negative for rash. Neurological: Negative for headaches, focal weakness or numbness.   ____________________________________________   PHYSICAL EXAM:  VITAL SIGNS: ED Triage Vitals [01/28/20 1929]  Enc Vitals Group     BP (!) 126/55     Pulse Rate (!) 104     Resp 18     Temp 98.9 F (37.2 C)     Temp Source Oral      SpO2 99 %     Weight 121 lb (54.9 kg)     Height 5\' 1"  (1.549 m)     Head Circumference      Peak Flow      Pain Score 5     Pain Loc      Pain Edu?      Excl. in GC?     Constitutional: Alert and oriented. Well appearing and in no acute distress. Eyes: Conjunctivae are normal. PERRL. EOMI. Head: Atraumatic. Nose: No congestion/rhinnorhea. Mouth/Throat: Mucous membranes are moist.  Oropharynx non-erythematous. Neck: No stridor.   Breasts: Symmetrical, no peau d'orange.  No warmth or erythema.  No fluctuance palpated.  No discharge expressed from either breast.  No axillary lymphadenopathy either side. Cardiovascular: Normal rate, regular rhythm. Grossly normal heart sounds.  Good peripheral circulation. Respiratory: Normal respiratory effort.  No retractions. Lungs CTAB. Gastrointestinal: Soft and nontender. No  distention. No abdominal bruits. No CVA tenderness. Musculoskeletal: No lower extremity tenderness nor edema.  No joint effusions. Neurologic:  Normal speech and language. No gross focal neurologic deficits are appreciated. No gait instability. Skin:  Skin is warm, dry and intact. No rash noted. Psychiatric: Mood and affect are normal. Speech and behavior are normal.  ____________________________________________   LABS (all labs ordered are listed, but only abnormal results are displayed)  Labs Reviewed  CBC WITH DIFFERENTIAL/PLATELET - Abnormal; Notable for the following components:      Result Value   Lymphs Abs 4.1 (*)    All other components within normal limits  COMPREHENSIVE METABOLIC PANEL - Abnormal; Notable for the following components:   Potassium 3.2 (*)    BUN <5 (*)    Total Protein 6.4 (*)    AST 14 (*)    All other components within normal limits  LACTIC ACID, PLASMA   ____________________________________________  EKG  None ____________________________________________  RADIOLOGY  ED MD interpretation: No abscess  Official radiology  report(s): No results found. Soft tissue breast ultrasound interpreted per Dr. Phill Myron: Negative targeted ultrasound of the left breast with no discrete abscess identified ____________________________________________   PROCEDURES  Procedure(s) performed (including Critical Care):  Procedures   ____________________________________________   INITIAL IMPRESSION / ASSESSMENT AND PLAN / ED COURSE  As part of my medical decision making, I reviewed the following data within the electronic MEDICAL RECORD NUMBER Nursing notes reviewed and incorporated, Labs reviewed, Old chart reviewed and Notes from prior ED visits     Erika Sanders was evaluated in Emergency Department on 01/29/2020 for the symptoms described in the history of present illness. She was evaluated in the context of the global COVID-19 pandemic, which necessitated consideration that the patient might be at risk for infection with the SARS-CoV-2 virus that causes COVID-19. Institutional protocols and algorithms that pertain to the evaluation of patients at risk for COVID-19 are in a state of rapid change based on information released by regulatory bodies including the CDC and federal and state organizations. These policies and algorithms were followed during the patient's care in the ED.    29 year old female presenting with breast discharge.  Differential diagnosis includes but is not limited to abscess, pituitary adenoma, metabolic etiologies, etc.  Laboratory results unremarkable; normal WBC and negative lactic acid.  Mild hypokalemia noted; will replete.  No clinical evidence of abscess on examination. Will obtain soft tissue ultrasound to evaluate for areolar abscess.   Clinical Course as of Jan 29 355  Sun Jan 29, 2020  7915 Addendum on chart review: Ultrasound negative for abscess.  Patient very concerned there was a foul odor from the breast discharge.  Will treat empirically with amoxicillin.  Patient will follow up  with her GYN.  Strict return precautions given.  Patient verbalized understanding and agreed with plan of care.   [JS]    Clinical Course User Index [JS] Irean Hong, MD     ____________________________________________   FINAL CLINICAL IMPRESSION(S) / ED DIAGNOSES  Final diagnoses:  Breast discharge  Hypokalemia     ED Discharge Orders         Ordered    amoxicillin (AMOXIL) 500 MG capsule  3 times daily     01/29/20 0118           Note:  This document was prepared using Dragon voice recognition software and may include unintentional dictation errors.   Irean Hong, MD 01/29/20 [phone]

## 2020-01-28 NOTE — ED Triage Notes (Signed)
Pt arrives ambulatory to triage with c/o green nipple DC. Pt reports squeezing her breasts like she was lactating and having DC. No abscess or redness noticed to breast at this time.

## 2020-01-29 MED ORDER — AMOXICILLIN 500 MG PO CAPS
500.0000 mg | ORAL_CAPSULE | Freq: Once | ORAL | Status: AC
  Administered 2020-01-29: 500 mg via ORAL
  Filled 2020-01-29: qty 1

## 2020-01-29 MED ORDER — AMOXICILLIN 500 MG PO CAPS: 500.0000 mg | ORAL_CAPSULE | Freq: Three times a day (TID) | ORAL | 0 refills | Status: AC

## 2020-01-29 NOTE — Discharge Instructions (Addendum)
Take antibiotic as prescribed (Amoxicillin 500 mg 3 times daily x7 days).  Return to the ER for worsening symptoms, persistent vomiting, fever or other concerns.

## 2020-01-29 NOTE — ED Notes (Signed)
Pt resting in bed. Pt states she just wants to go home. Pt's friend states they will leave if they have to wait for a long time and he has no intention of both of them staying in the ER all night. Pt given verbal reassurance. Pt states she found lump in left breast a year ago and has never had it checked. Pt states she is expressing green discharge from both breasts.

## 2020-02-03 ENCOUNTER — Other Ambulatory Visit: Payer: Self-pay | Admitting: Obstetrics and Gynecology

## 2020-02-03 DIAGNOSIS — N6012 Diffuse cystic mastopathy of left breast: Secondary | ICD-10-CM

## 2020-02-03 DIAGNOSIS — N643 Galactorrhea not associated with childbirth: Secondary | ICD-10-CM

## 2020-02-16 ENCOUNTER — Ambulatory Visit
Admission: RE | Admit: 2020-02-16 | Discharge: 2020-02-16 | Disposition: A | Discharge status: Home / Self Care | Payer: Medicaid Other | Source: Ambulatory Visit | Attending: Obstetrics and Gynecology | Admitting: Obstetrics and Gynecology

## 2020-02-16 DIAGNOSIS — N6012 Diffuse cystic mastopathy of left breast: Secondary | ICD-10-CM | POA: Insufficient documentation

## 2020-02-16 DIAGNOSIS — N643 Galactorrhea not associated with childbirth: Secondary | ICD-10-CM | POA: Insufficient documentation

## 2020-09-15 ENCOUNTER — Other Ambulatory Visit: Payer: Self-pay

## 2020-09-15 ENCOUNTER — Other Ambulatory Visit: Payer: Medicaid Other

## 2020-09-15 DIAGNOSIS — Z20822 Contact with and (suspected) exposure to covid-19: Secondary | ICD-10-CM

## 2020-09-18 LAB — NOVEL CORONAVIRUS, NAA: SARS-CoV-2, NAA: NOT DETECTED

## 2020-09-27 ENCOUNTER — Other Ambulatory Visit: Payer: Medicaid Other

## 2020-10-02 ENCOUNTER — Other Ambulatory Visit: Payer: Medicaid Other

## 2020-10-02 DIAGNOSIS — Z20822 Contact with and (suspected) exposure to covid-19: Secondary | ICD-10-CM

## 2020-10-03 LAB — SARS-COV-2, NAA 2 DAY TAT

## 2020-10-03 LAB — NOVEL CORONAVIRUS, NAA: SARS-CoV-2, NAA: NOT DETECTED

## 2020-11-27 IMAGING — CR DG CHEST 2V
1 series · 2 of 2 positions shown · non-contrast
Comparison: PA and lateral chest 10/08/2008.

CLINICAL DATA: Posterior neck pain radiating to the right shoulder.

EXAM:
CHEST - 2 VIEW

[Series 1: dg chest 2 view · 0.14mm/px · 2 of 2 slices shown]
[im 1/2]
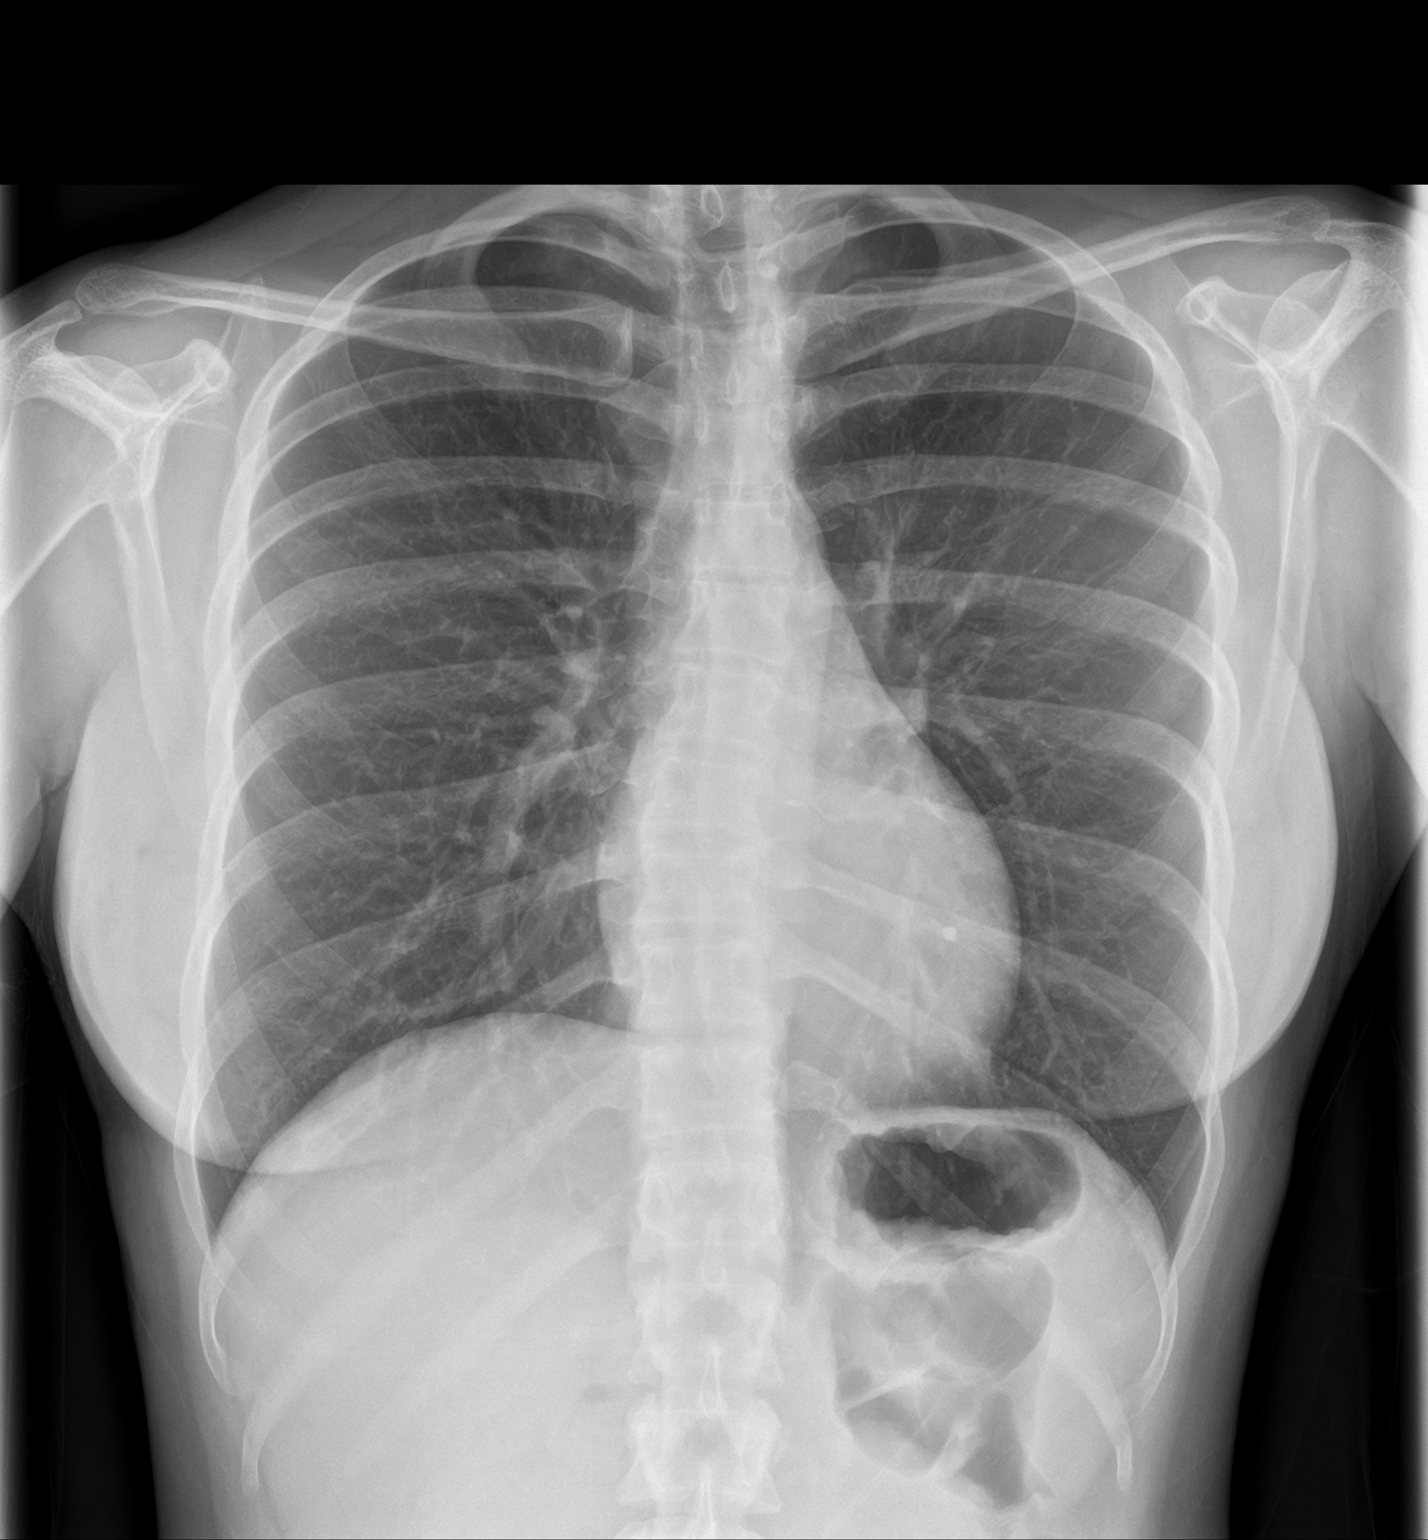
[im 2/2]
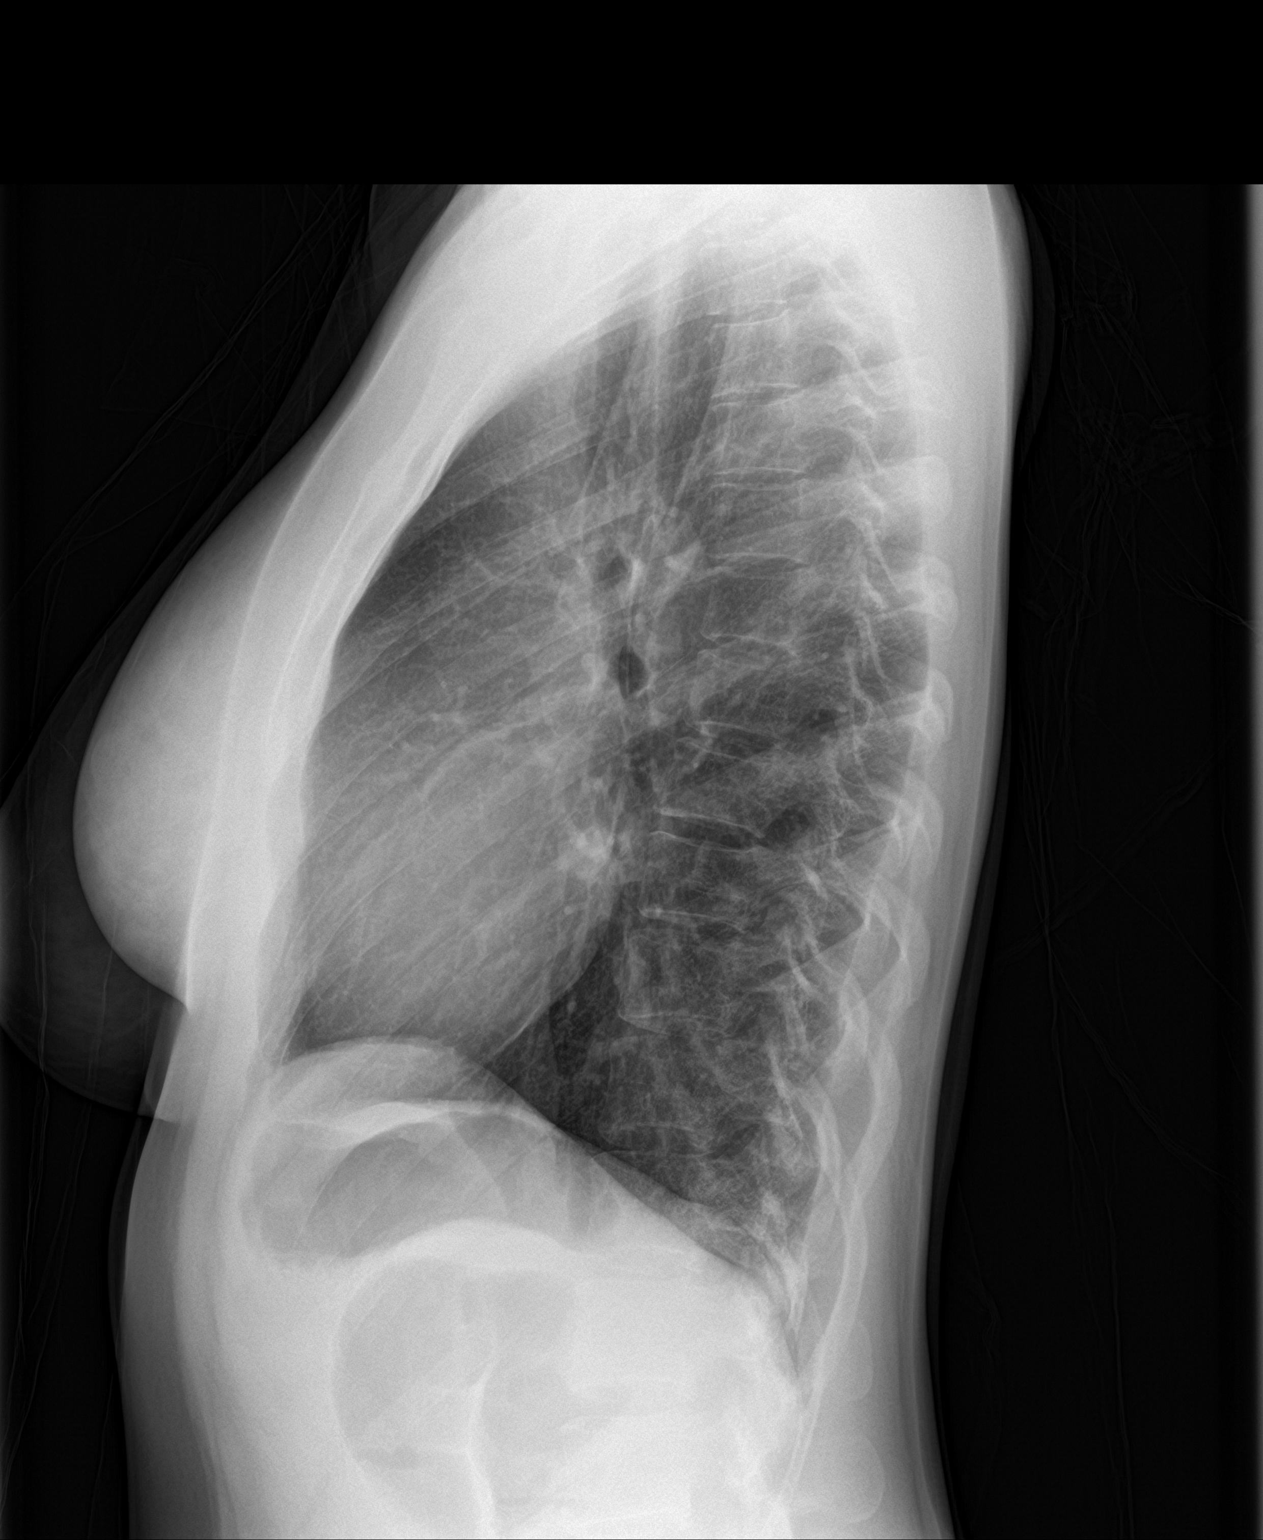

[2 of 2 positions shown; findings below may reference images not displayed]

FINDINGS: Lungs clear. Heart size normal. No pneumothorax or pleural fluid. No
bony abnormality.
IMPRESSION: Negative chest.

## 2021-02-26 IMAGING — US US SOFT TISSUE
1 series · 7 of 7 positions shown · non-contrast
Comparison: None available.

CLINICAL DATA: Initial evaluation for left-sided nipple discharge,
concern for abscess.

EXAM:
ULTRASOUND OF SOFT TISSUES
TECHNIQUE: Ultrasound examination of the soft tissues was performed in the area
of clinical concern.

[Series 1: us chest soft tissue · 7 of 7 slices shown]
[im 1/7]
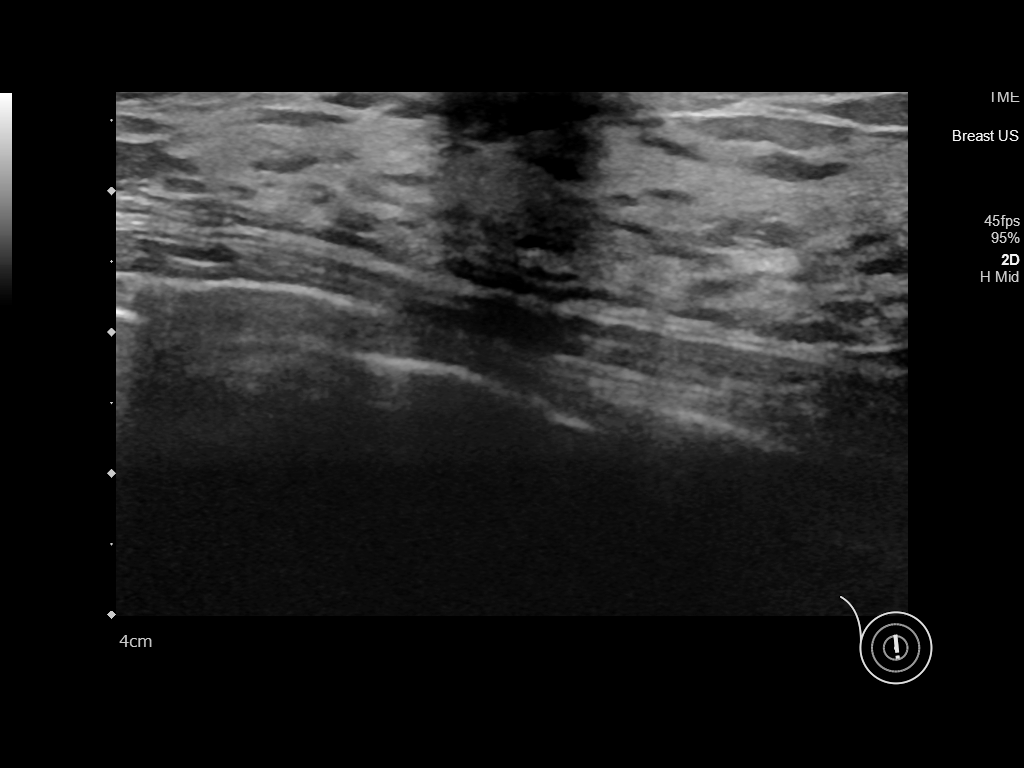
[im 2/7]
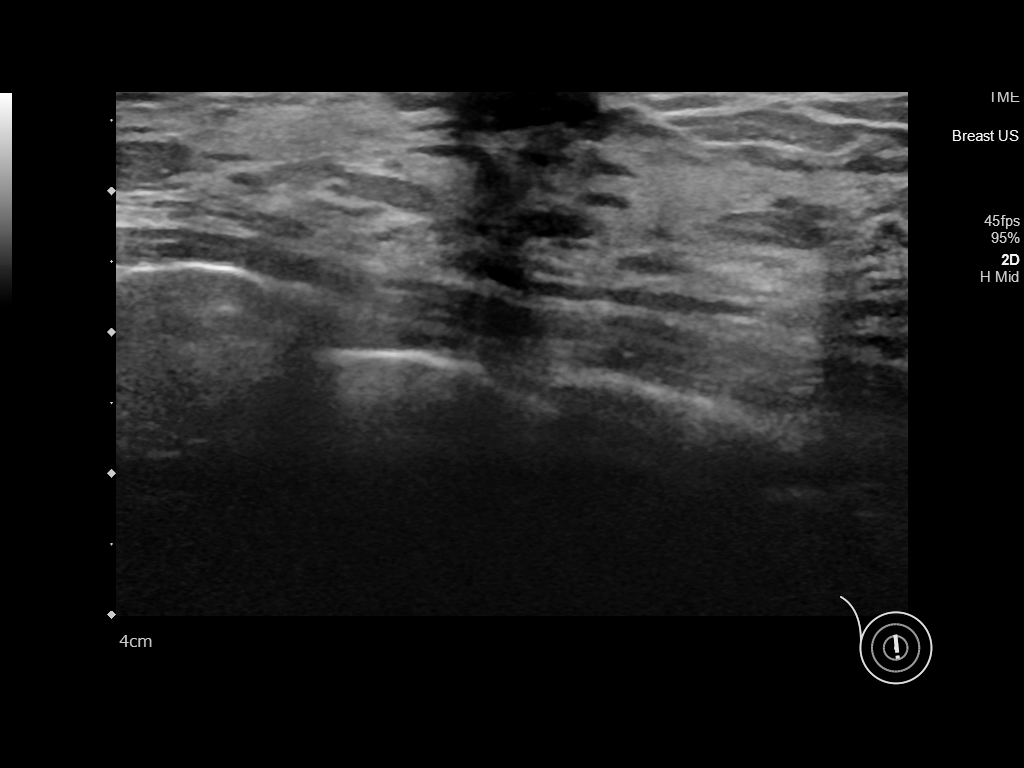
[im 3/7]
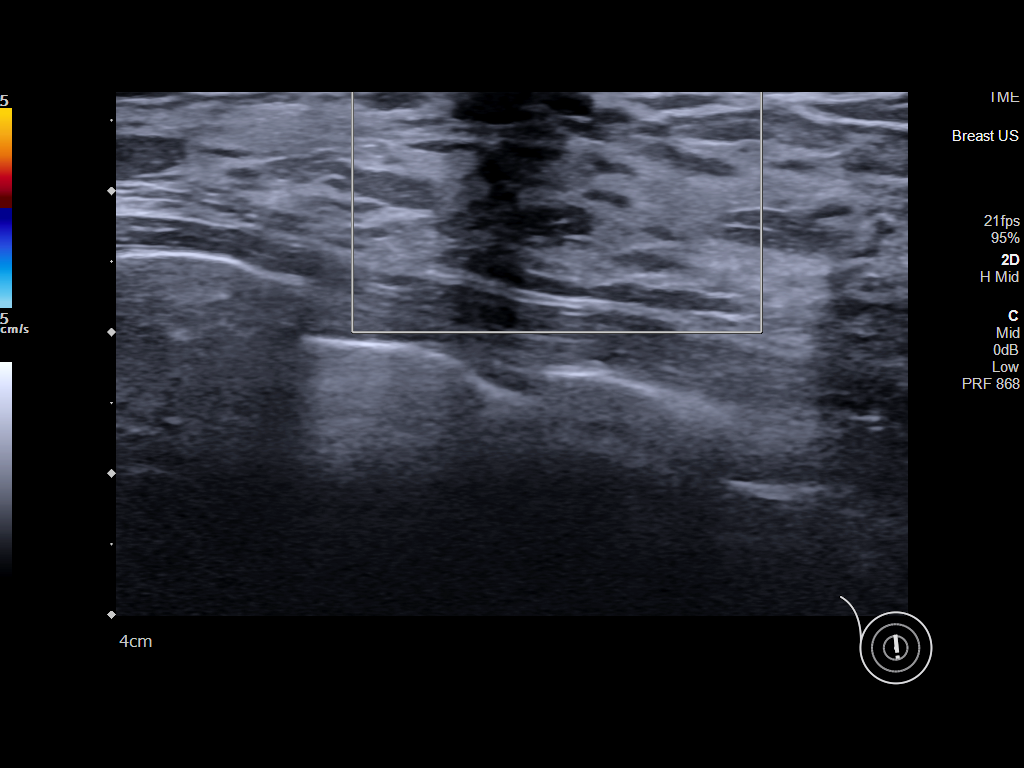
[im 4/7]
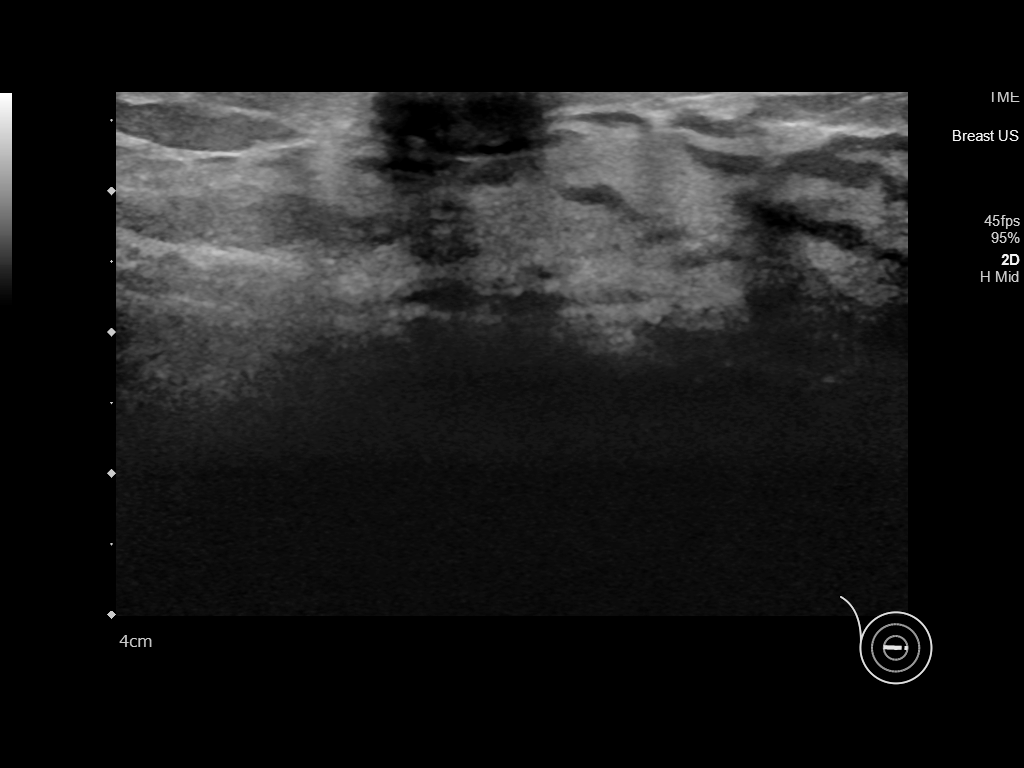
[im 5/7]
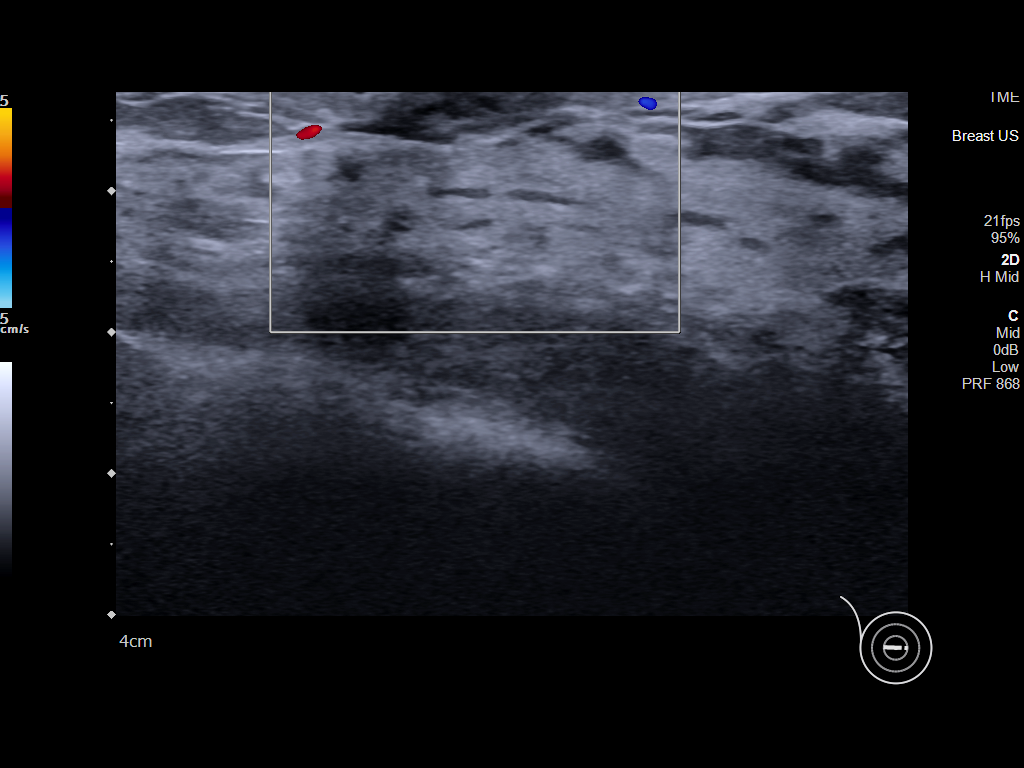
[im 6/7]
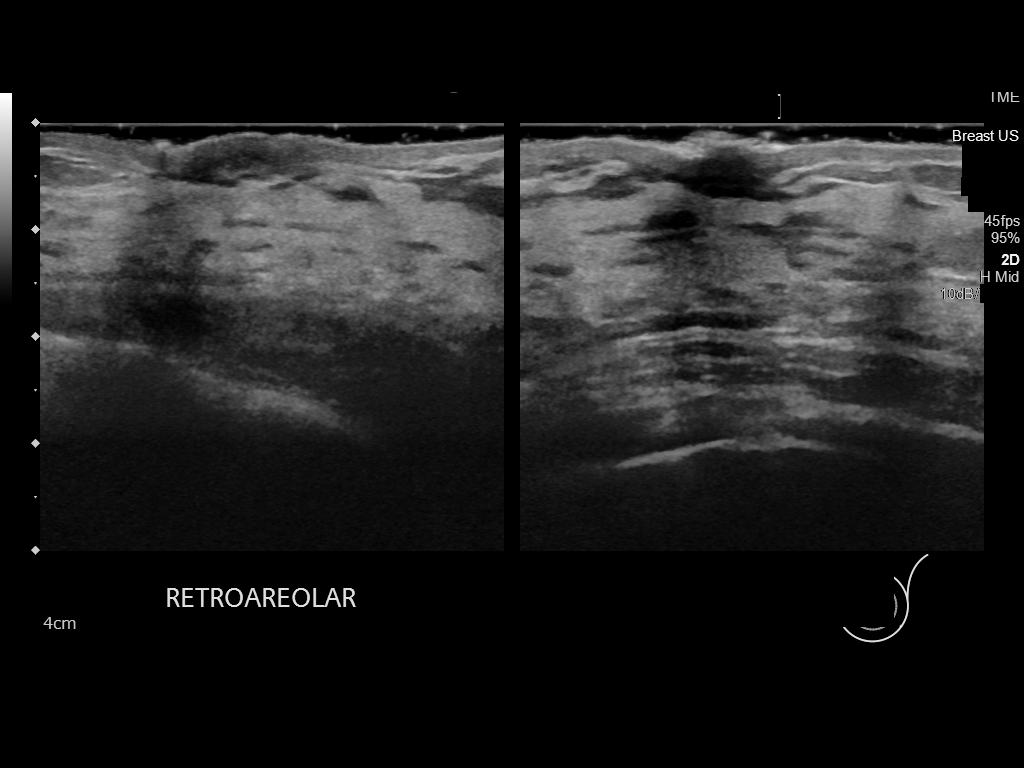
[im 7/7]
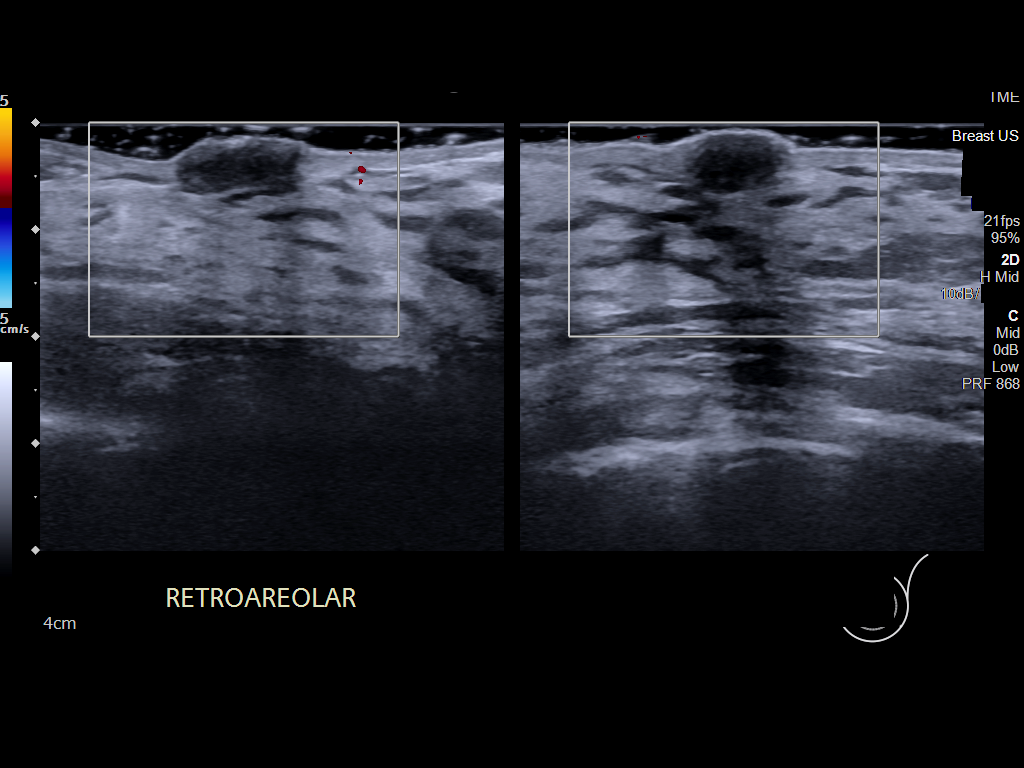

[7 of 7 positions shown; findings below may reference images not displayed]

FINDINGS: Targeted ultrasound of the left central left breast at the level of
the nipple was performed. Ultrasound demonstrates no loculated
collection to suggest abscess. No appreciable soft tissue edema or
inflammatory changes seen within the visualized breast parenchyma.
No appreciable overlying skin thickening or inflammatory change. No
other visible discrete mass, ductal dilatation, or other
abnormality.
IMPRESSION: BI-RADS 1: Negative targeted ultrasound of the left breast with no
discrete abscess identified.

If clinical symptoms persist, then outpatient referral to a
dedicated breast [REDACTED] or breast surgeon would be
recommended for further workup and evaluation.

## 2021-05-26 ENCOUNTER — Emergency Department
Admission: EM | Admit: 2021-05-26 | Discharge: 2021-05-26 | Disposition: A | Discharge status: Home / Self Care | Payer: Medicaid Other | Attending: Emergency Medicine | Admitting: Emergency Medicine

## 2021-05-26 ENCOUNTER — Other Ambulatory Visit: Payer: Self-pay

## 2021-05-26 DIAGNOSIS — R4781 Slurred speech: Secondary | ICD-10-CM | POA: Insufficient documentation

## 2021-05-26 DIAGNOSIS — R6883 Chills (without fever): Secondary | ICD-10-CM | POA: Insufficient documentation

## 2021-05-26 DIAGNOSIS — Z5321 Procedure and treatment not carried out due to patient leaving prior to being seen by health care provider: Secondary | ICD-10-CM | POA: Insufficient documentation

## 2021-05-26 NOTE — ED Triage Notes (Signed)
First nurse note: pt comes ems after getting cold at church today. States she never warmed up after getting cold. Refused a blanket from ems. Boyfriend things pt may be slurring her words but stroke screen was negative for EMS. VSS.

## 2021-05-26 NOTE — ED Triage Notes (Signed)
See first nurse note: pt states she started feeling cold at church so she came home and took a warm bath- pt states she then started having pressure in her knees and she took some ibuprofen- pt then states she started feeling cold again and her boyfriend told her that she was slurring her words- pt speaking in clear sentences in triage- pt also having lower back pain
